# Patient Record
Sex: Female | Born: 1965 | Race: White | Hispanic: No | Marital: Married | Smoking: Former smoker
Health system: Southern US, Community
[De-identification: ages and names within clinical notes are randomized; demographics above are authoritative.]

## PROBLEM LIST (undated history)

## (undated) DIAGNOSIS — J45909 Unspecified asthma, uncomplicated: Secondary | ICD-10-CM

## (undated) DIAGNOSIS — E079 Disorder of thyroid, unspecified: Secondary | ICD-10-CM

## (undated) DIAGNOSIS — I10 Essential (primary) hypertension: Secondary | ICD-10-CM

## (undated) DIAGNOSIS — D649 Anemia, unspecified: Secondary | ICD-10-CM

## (undated) DIAGNOSIS — E039 Hypothyroidism, unspecified: Secondary | ICD-10-CM

## (undated) DIAGNOSIS — N393 Stress incontinence (female) (male): Secondary | ICD-10-CM

## (undated) HISTORY — PX: CARDIAC CATHETERIZATION: SHX172

---

## 2005-03-11 ENCOUNTER — Ambulatory Visit: Payer: Self-pay | Admitting: Podiatry

## 2012-07-18 ENCOUNTER — Ambulatory Visit: Payer: Self-pay | Admitting: Internal Medicine

## 2012-09-05 DIAGNOSIS — N393 Stress incontinence (female) (male): Secondary | ICD-10-CM | POA: Insufficient documentation

## 2015-07-17 DIAGNOSIS — F330 Major depressive disorder, recurrent, mild: Secondary | ICD-10-CM | POA: Insufficient documentation

## 2015-11-12 ENCOUNTER — Other Ambulatory Visit: Payer: Self-pay | Admitting: Obstetrics & Gynecology

## 2015-11-12 DIAGNOSIS — Z1231 Encounter for screening mammogram for malignant neoplasm of breast: Secondary | ICD-10-CM

## 2015-11-22 ENCOUNTER — Ambulatory Visit
Admission: EM | Admit: 2015-11-22 | Discharge: 2015-11-22 | Disposition: A | Discharge status: Home / Self Care | Payer: BC Managed Care – PPO | Attending: Family Medicine | Admitting: Family Medicine

## 2015-11-22 ENCOUNTER — Encounter: Payer: Self-pay | Admitting: Emergency Medicine

## 2015-11-22 DIAGNOSIS — S39012A Strain of muscle, fascia and tendon of lower back, initial encounter: Secondary | ICD-10-CM

## 2015-11-22 HISTORY — DX: Disorder of thyroid, unspecified: E07.9

## 2015-11-22 LAB — URINALYSIS COMPLETE WITH MICROSCOPIC (ARMC ONLY)
BACTERIA UA: NONE SEEN
BILIRUBIN URINE: NEGATIVE
GLUCOSE, UA: NEGATIVE mg/dL
HGB URINE DIPSTICK: NEGATIVE
Ketones, ur: NEGATIVE mg/dL
Leukocytes, UA: NEGATIVE
NITRITE: NEGATIVE
Protein, ur: NEGATIVE mg/dL
RBC / HPF: NONE SEEN RBC/hpf (ref 0–5)
Specific Gravity, Urine: 1.01 (ref 1.005–1.030)
pH: 7 (ref 5.0–8.0)

## 2015-11-22 MED ORDER — IBUPROFEN 800 MG PO TABS: 800.0000 mg | ORAL_TABLET | Freq: Three times a day (TID) | ORAL | Status: DC

## 2015-11-22 MED ORDER — CYCLOBENZAPRINE HCL 10 MG PO TABS: 10.0000 mg | ORAL_TABLET | Freq: Every day | ORAL | Status: DC

## 2015-11-22 MED ORDER — HYDROCODONE-ACETAMINOPHEN 5-325 MG PO TABS: 1.0000 | ORAL_TABLET | Freq: Four times a day (QID) | ORAL | Status: DC | PRN

## 2015-11-22 NOTE — ED Provider Notes (Signed)
CSN: [id number]     Arrival date & time 11/22/15  0803 History   First MD Initiated Contact with Patient 11/22/15 [phone]     Chief Complaint  Patient presents with  . Back Pain   (Consider location/radiation/quality/duration/timing/severity/associated sxs/prior Treatment) Patient is a 50 y.o. female presenting with back pain. The history is provided by the patient.  Back Pain Location:  Lumbar spine Quality:  Aching Radiates to:  Does not radiate Pain severity:  Moderate Pain is:  Same all the time Onset quality:  Sudden Duration:  1 day Timing:  Constant Chronicity:  New Context: lifting heavy objects   Context: not emotional stress, not falling, not jumping from heights, not MCA, not MVA, not occupational injury, not pedestrian accident, not physical stress, not recent illness, not recent injury and not twisting   Relieved by:  None tried Ineffective treatments:  None tried Associated symptoms: no abdominal pain, no abdominal swelling, no bladder incontinence, no bowel incontinence, no chest pain, no dysuria, no fever, no headaches, no leg pain, no numbness, no paresthesias, no pelvic pain, no perianal numbness, no tingling, no weakness and no weight loss   Risk factors: no hx of cancer, no hx of osteoporosis, no lack of exercise, no menopause, not obese, not pregnant, no recent surgery, no steroid use and no vascular disease     Past Medical History  Diagnosis Date  . Thyroid disease    History reviewed. No pertinent past surgical history. History reviewed. No pertinent family history. Social History  Substance Use Topics  . Smoking status: Never Smoker   . Smokeless tobacco: None  . Alcohol Use: No   OB History    No data available     Review of Systems  Constitutional: Negative for fever and weight loss.  Cardiovascular: Negative for chest pain.  Gastrointestinal: Negative for abdominal pain and bowel incontinence.  Genitourinary: Negative for bladder incontinence,  dysuria and pelvic pain.  Musculoskeletal: Positive for back pain.  Neurological: Negative for tingling, weakness, numbness, headaches and paresthesias.    Allergies  Review of patient's allergies indicates no known allergies.  Home Medications   Prior to Admission medications   Medication Sig Start Date End Date Taking? Authorizing Provider  levothyroxine (SYNTHROID, LEVOTHROID) 100 MCG tablet Take 100 mcg by mouth daily before breakfast.   Yes Historical Provider, MD  cyclobenzaprine (FLEXERIL) 10 MG tablet Take 1 tablet (10 mg total) by mouth at bedtime. 11/22/15   Payton Mccallumrlando Marzell Isakson, MD  HYDROcodone-acetaminophen (NORCO/VICODIN) 5-325 MG tablet Take 1-2 tablets by mouth every 6 (six) hours as needed. 11/22/15   Payton Mccallumrlando Ryin Schillo, MD  ibuprofen (ADVIL,MOTRIN) 800 MG tablet Take 1 tablet (800 mg total) by mouth 3 (three) times daily. 11/22/15   Payton Mccallumrlando Kynsie Falkner, MD   Meds Ordered and Administered this Visit  Medications - No data to display  BP 146/84 mmHg  Pulse 88  Temp(Src) 97.2 F (36.2 C) (Tympanic)  Resp 16  Ht 5\' 3"  (1.6 m)  Wt 165 lb (74.844 kg)  BMI 29.24 kg/m2  SpO2 100%  LMP 11/08/2015 (Approximate) No data found.   Physical Exam  Constitutional: She appears well-developed and well-nourished. No distress.  Musculoskeletal: She exhibits tenderness. She exhibits no edema.       Lumbar back: She exhibits tenderness (over the left lumbar sacral paraspinous muscles) and spasm. She exhibits normal range of motion, no bony tenderness, no swelling, no edema, no deformity, no laceration, no pain and normal pulse.  Neurological: She is alert. She has  normal reflexes. She displays normal reflexes. She exhibits normal muscle tone. Coordination normal.  Skin: Skin is warm and dry. No rash noted. She is not diaphoretic. No erythema.  Nursing note and vitals reviewed.   ED Course  Procedures (including critical care time)  Labs Review Labs Reviewed  URINALYSIS COMPLETEWITH MICROSCOPIC  ([facility] ONLY) - Abnormal; Notable for the following:    Color, Urine STRAW (*)    Squamous Epithelial / LPF 0-5 (*)    All other components within normal limits    Imaging Review No results found.   Visual Acuity Review  Right Eye Distance:   Left Eye Distance:   Bilateral Distance:    Right Eye Near:   Left Eye Near:    Bilateral Near:         MDM   1. Low back strain, initial encounter    Discharge Medication List as of 11/22/2015  9:07 AM    START taking these medications   Details  cyclobenzaprine (FLEXERIL) 10 MG tablet Take 1 tablet (10 mg total) by mouth at bedtime., Starting 11/22/2015, Until Discontinued, Normal    HYDROcodone-acetaminophen (NORCO/VICODIN) 5-325 MG tablet Take 1-2 tablets by mouth every 6 (six) hours as needed., Starting 11/22/2015, Until Discontinued, Print    ibuprofen (ADVIL,MOTRIN) 800 MG tablet Take 1 tablet (800 mg total) by mouth 3 (three) times daily., Starting 11/22/2015, Until Discontinued, Normal       1. diagnosis reviewed with patient 2. rx as per orders above; reviewed possible side effects, interactions, risks and benefits  3. Recommend supportive treatment with heat, gentle stretching 4. Follow-up prn if symptoms worsen or don't improve    Payton Mccallum, MD 11/22/15 [phone]

## 2015-11-22 NOTE — ED Notes (Signed)
Patient c/o left sided lower back pain that started yesterday.  Patient reports pain is worse with movement and when sitting.  Patient denies fevers.  Patient denies N/V. Patient denies injury or fall.

## 2016-01-06 ENCOUNTER — Ambulatory Visit: Payer: BC Managed Care – PPO

## 2016-02-03 ENCOUNTER — Ambulatory Visit (INDEPENDENT_AMBULATORY_CARE_PROVIDER_SITE_OTHER): Payer: Self-pay | Admitting: Urology

## 2016-02-03 VITALS — BP 142/79 | HR 80 | Ht 63.0 in | Wt 171.4 lb

## 2016-02-03 DIAGNOSIS — N3946 Mixed incontinence: Secondary | ICD-10-CM | POA: Diagnosis not present

## 2016-02-03 DIAGNOSIS — N820 Vesicovaginal fistula: Secondary | ICD-10-CM | POA: Diagnosis not present

## 2016-02-03 LAB — MICROSCOPIC EXAMINATION

## 2016-02-03 LAB — URINALYSIS, COMPLETE
BILIRUBIN UA: NEGATIVE
Glucose, UA: NEGATIVE
Ketones, UA: NEGATIVE
Nitrite, UA: NEGATIVE
PH UA: 6 (ref 5.0–7.5)
Protein, UA: NEGATIVE
Specific Gravity, UA: 1.015 (ref 1.005–1.030)
Urobilinogen, Ur: 0.2 mg/dL (ref 0.2–1.0)

## 2016-02-03 NOTE — Progress Notes (Signed)
02/03/2016 3:19 PM   Gabrielle Mendoza 1965-07-06 [id number]  Referring provider: Rolm Gala, MD [address] [place], [place] [address]  Chief Complaint  Patient presents with  . New Patient (Initial Visit)    vesicovaginal fistula, pessary    HPI: The patient has mixed stress and urge incontinence worsening over a number years. She leaks with coughing sneezing bending lifting. She is urge incontinence and denies enuresis. Both are significant. I think sometimes she can trigger a high volume leakage with coughing  She voids every 2-3 hours and sometimes more frequently. She gets up 3 times a night and has mild ankle edema. She is prone to constipation and by history tried one medication to cause dry mouth  She has no neurologic issues and has not had previous GU surgery. She has not had a hysterectomy. She does not get urinary tract infections  Modifying factors: There are no other modifying factors  Associated signs and symptoms: There are no other associated signs and symptoms Aggravating and relieving factors: There are no other aggravating or relieving factors Severity: Moderate Duration: Persistent   PMH: Past Medical History:  Diagnosis Date  . Thyroid disease     Surgical History: No past surgical history on file.  Home Medications:    Medication List       Accurate as of 02/03/16  3:19 PM. Always use your most recent med list.          cyclobenzaprine 10 MG tablet Commonly known as:  FLEXERIL Take 1 tablet (10 mg total) by mouth at bedtime.   HYDROcodone-acetaminophen 5-325 MG tablet Commonly known as:  NORCO/VICODIN Take 1-2 tablets by mouth every 6 (six) hours as needed.   ibuprofen 800 MG tablet Commonly known as:  ADVIL,MOTRIN Take 1 tablet (800 mg total) by mouth 3 (three) times daily.   levothyroxine 100 MCG tablet Commonly known as:  SYNTHROID, LEVOTHROID Take 100 mcg by mouth daily before breakfast.       Allergies:  Allergies    Allergen Reactions  . Tramadol Nausea Only    Family History: No family history on file.  Social History:  reports that she has never smoked. She does not have any smokeless tobacco history on file. She reports that she does not drink alcohol. Her drug history is not on file.  ROS: UROLOGY Frequent Urination?: Yes Hard to postpone urination?: Yes Burning/pain with urination?: No Get up at night to urinate?: Yes Leakage of urine?: Yes Urine stream starts and stops?: No Trouble starting stream?: No Do you have to strain to urinate?: No Blood in urine?: No Urinary tract infection?: No Sexually transmitted disease?: No Injury to kidneys or bladder?: No Painful intercourse?: No Weak stream?: No Currently pregnant?: No Vaginal bleeding?: No Last menstrual period?: n  Gastrointestinal Nausea?: No Vomiting?: No Indigestion/heartburn?: No Diarrhea?: No Constipation?: No  Constitutional Fever: No Night sweats?: Yes Weight loss?: No Fatigue?: Yes  Skin Skin rash/lesions?: No Itching?: No  Eyes Blurred vision?: No Double vision?: No  Ears/Nose/Throat Sore throat?: No Sinus problems?: No  Hematologic/Lymphatic Swollen glands?: No Easy bruising?: No  Cardiovascular Leg swelling?: No Chest pain?: No  Respiratory Cough?: No Shortness of breath?: Yes  Endocrine Excessive thirst?: No  Musculoskeletal Back pain?: No Joint pain?: No  Neurological Headaches?: No Dizziness?: No  Psychologic Depression?: No Anxiety?: No  Physical Exam: BP (!) 142/79   Pulse 80   Ht  (1.6 m)   Wt 171 lb 6.4 oz (77.7 kg)  BMI 30.36 kg/m   Constitutional:  Alert and oriented, No acute distress. HEENT: [place] AT, moist mucus membranes.  Trachea midline, no masses. Cardiovascular: No clubbing, cyanosis, or edema. Respiratory: Normal respiratory effort, no increased work of breathing. GI: Abdomen is soft, nontender, nondistended, no abdominal masses GU: Mild grade 2  hyper mobility of the bladder neck and a mild positive cough test with no prolapse Skin: No rashes, bruises or suspicious lesions. Lymph: No cervical or inguinal adenopathy. Neurologic: Grossly intact, no focal deficits, moving all 4 extremities. Psychiatric: Normal mood and affect.  Laboratory Data: No results found for: WBC, HGB, HCT, MCV, PLT      Component Value Date/Time   COLORURINE STRAW (A) 11/22/2015 0822   APPEARANCEUR CLEAR 11/22/2015 0822   LABSPEC 1.010 11/22/2015 0822   PHURINE 7.0 11/22/2015 0822   GLUCOSEU NEGATIVE 11/22/2015 0822   HGBUR NEGATIVE 11/22/2015 0822   BILIRUBINUR NEGATIVE 11/22/2015 0822   KETONESUR NEGATIVE 11/22/2015 0822   PROTEINUR NEGATIVE 11/22/2015 0822   NITRITE NEGATIVE 11/22/2015 0822   LEUKOCYTESUR NEGATIVE 11/22/2015 16100822    Pertinent Imaging: None  Assessment & Plan:  The patient has mixed incontinence. She has mild frequency and moderate nocturia. Pathophysiology discussed. Role of urodynamics discussed.  The patient was tested for a possible fistula a may have had a little bit of blue dye on the pad in the vagina. She does not have a risk factor for the diagnosis and I do not believe she leaks from the vagina. Having said that we will look for this also during the urodynamic test. There was no urine in her vaginal vault today  1. Mixed incontinence 2. Urinary frequency 3. Nocturia - Urinalysis, Complete   No Follow-up on file.  Martina SinnerMACDIARMID,Jeily Guthridge A, MD  [facility] [address], [address] [place], [place] [address] [phone]

## 2016-03-23 ENCOUNTER — Ambulatory Visit: Payer: BC Managed Care – PPO

## 2017-02-02 ENCOUNTER — Other Ambulatory Visit: Payer: Self-pay | Admitting: Family Medicine

## 2017-02-02 DIAGNOSIS — Z1231 Encounter for screening mammogram for malignant neoplasm of breast: Secondary | ICD-10-CM

## 2017-02-09 ENCOUNTER — Inpatient Hospital Stay: Admission: RE | Admit: 2017-02-09 | Payer: BC Managed Care – PPO | Source: Ambulatory Visit

## 2017-07-23 DIAGNOSIS — Z862 Personal history of diseases of the blood and blood-forming organs and certain disorders involving the immune mechanism: Secondary | ICD-10-CM | POA: Insufficient documentation

## 2017-07-23 DIAGNOSIS — R1013 Epigastric pain: Secondary | ICD-10-CM | POA: Insufficient documentation

## 2017-12-21 ENCOUNTER — Ambulatory Visit
Admission: RE | Admit: 2017-12-21 | Discharge: 2017-12-21 | Disposition: A | Discharge status: Home / Self Care | Payer: BC Managed Care – PPO | Source: Ambulatory Visit | Attending: Family Medicine | Admitting: Family Medicine

## 2017-12-21 ENCOUNTER — Encounter (INDEPENDENT_AMBULATORY_CARE_PROVIDER_SITE_OTHER): Payer: Self-pay

## 2017-12-21 DIAGNOSIS — Z1231 Encounter for screening mammogram for malignant neoplasm of breast: Secondary | ICD-10-CM | POA: Diagnosis present

## 2018-12-02 DIAGNOSIS — I10 Essential (primary) hypertension: Secondary | ICD-10-CM | POA: Insufficient documentation

## 2019-05-22 ENCOUNTER — Other Ambulatory Visit: Payer: Self-pay

## 2019-05-22 DIAGNOSIS — Z20822 Contact with and (suspected) exposure to covid-19: Secondary | ICD-10-CM

## 2019-05-23 LAB — NOVEL CORONAVIRUS, NAA: SARS-CoV-2, NAA: NOT DETECTED

## 2019-07-03 ENCOUNTER — Other Ambulatory Visit: Payer: Self-pay | Admitting: Physician Assistant

## 2019-07-03 DIAGNOSIS — M2391 Unspecified internal derangement of right knee: Secondary | ICD-10-CM

## 2019-07-12 ENCOUNTER — Other Ambulatory Visit: Payer: Self-pay

## 2019-07-12 ENCOUNTER — Ambulatory Visit
Admission: RE | Admit: 2019-07-12 | Discharge: 2019-07-12 | Disposition: A | Discharge status: Home / Self Care | Payer: BC Managed Care – PPO | Source: Ambulatory Visit | Attending: Physician Assistant | Admitting: Physician Assistant

## 2019-07-12 DIAGNOSIS — M2391 Unspecified internal derangement of right knee: Secondary | ICD-10-CM

## 2019-07-28 ENCOUNTER — Ambulatory Visit
Admission: RE | Admit: 2019-07-28 | Discharge: 2019-07-28 | Disposition: A | Discharge status: Home / Self Care | Payer: BC Managed Care – PPO | Source: Ambulatory Visit | Attending: Orthopedic Surgery | Admitting: Orthopedic Surgery

## 2019-07-28 ENCOUNTER — Other Ambulatory Visit: Payer: Self-pay

## 2019-07-28 ENCOUNTER — Other Ambulatory Visit: Admission: RE | Admit: 2019-07-28 | Payer: BC Managed Care – PPO | Source: Ambulatory Visit

## 2019-07-28 ENCOUNTER — Encounter
Admission: RE | Admit: 2019-07-28 | Discharge: 2019-07-28 | Disposition: A | Discharge status: Home / Self Care | Payer: BC Managed Care – PPO | Source: Ambulatory Visit | Attending: Orthopedic Surgery | Admitting: Orthopedic Surgery

## 2019-07-28 DIAGNOSIS — Z01818 Encounter for other preprocedural examination: Secondary | ICD-10-CM | POA: Insufficient documentation

## 2019-07-28 DIAGNOSIS — Z20822 Contact with and (suspected) exposure to covid-19: Secondary | ICD-10-CM | POA: Diagnosis not present

## 2019-07-28 HISTORY — DX: Essential (primary) hypertension: I10

## 2019-07-28 HISTORY — DX: Unspecified asthma, uncomplicated: J45.909

## 2019-07-28 HISTORY — DX: Hypothyroidism, unspecified: E03.9

## 2019-07-28 HISTORY — DX: Stress incontinence (female) (male): N39.3

## 2019-07-28 HISTORY — DX: Anemia, unspecified: D64.9

## 2019-07-28 LAB — SARS CORONAVIRUS 2 (TAT 6-24 HRS): SARS Coronavirus 2: NEGATIVE

## 2019-07-28 NOTE — Patient Instructions (Signed)
Your procedure is scheduled on: Mon 2/1 Report to Day Surgery. To find out your arrival time please call [phone] between 1PM - 3PM on Friday 1/29.  Remember: Instructions that are not followed completely may result in serious medical risk,  up to and including death, or upon the discretion of your surgeon and anesthesiologist your  surgery may need to be rescheduled.     _X__ 1. Do not eat food after midnight the night before your procedure.                 No gum chewing or hard candies. You may drink clear liquids up to 2 hours                 before you are scheduled to arrive for your surgery- DO not drink clear                 liquids within 2 hours of the start of your surgery.                 Clear Liquids include:  water, apple juice without pulp, clear carbohydrate                 drink such as Clearfast of Gatorade, Black Coffee or Tea (Do not add                 anything to coffee or tea).  __X__2.  On the morning of surgery brush your teeth with toothpaste and water, you                may rinse your mouth with mouthwash if you wish.  Do not swallow any toothpaste of mouthwash.     _X__ 3.  No Alcohol for 24 hours before or after surgery.   ___ 4.  Do Not Smoke or use e-cigarettes For 24 Hours Prior to Your Surgery.                 Do not use any chewable tobacco products for at least 6 hours prior to                 surgery.  ____  5.  Bring all medications with you on the day of surgery if instructed.   _x___  6.  Notify your doctor if there is any change in your medical condition      (cold, fever, infections).     Do not wear jewelry, make-up, hairpins, clips or nail polish. Do not wear lotions, powders, or perfumes. You may wear deodorant. Do not shave 48 hours prior to surgery. Men may shave face and neck. Do not bring valuables to the hospital.    [facility] is not responsible for any belongings or valuables.  Contacts, dentures  or bridgework may not be worn into surgery. Leave your suitcase in the car. After surgery it may be brought to your room. For patients admitted to the hospital, discharge time is determined by your treatment team.   Patients discharged the day of surgery will not be allowed to drive home.   Please read over the following fact sheets that you were given:    __x__ Take these medicines the morning of surgery with A SIP OF WATER:    1. levothyroxine (SYNTHROID, LEVOTHROID) 100 MCG tablet  2.   3.   4.  5.  6.  ____ Fleet Enema (as directed)   __x__ Use CHG Soap as directed  __x__ Use  inhalers on the day of surgery albuterol (VENTOLIN HFA) 108 (90 Base) MCG/ACT inhaler  And bring with you  ____ Stop metformin 2 days prior to surgery    ____ Take 1/2 of usual insulin dose the night before surgery. No insulin the morning          of surgery.   ____ Stop Coumadin/Plavix/aspirin on   __x__ Stop Anti-inflammatories ibuprofen (ADVIL) 200 MG tablet motrin aleve or aspirin    ____ Stop supplements until after surgery.    ____ Bring C-Pap to the hospital.

## 2019-07-30 NOTE — H&P (Signed)
ORTHOPAEDIC HISTORY & PHYSICAL  Progress Notes by Shari Heritage., MD at 07/20/2019 11:30 AM  Chief Complaint:     Chief Complaint  Patient presents with  . Knee Pain    Right knee meniscus tear, MRI 07/12/19    Reason for Visit: The patient is a 54 y.o. female who presents today for reevaluation of her right knee. She reports a 2 month(s) history of right knee pain without any apparent trauma or aggravating event.  She localizes most of the pain along the medial aspect of the knee. She reports some swelling, no locking, and some giving way of the knee. The pain is aggravated by going up and down stairs, lateral movements, pivoting, rising after sitting and squatting. She does have some pain at night that interferes with her sleep.The patient has not appreciated any significant improvement despite NSAIDs, intraarticular corticosteroid injection, and activity modification.   Medications: Current Medications        Current Outpatient Medications  Medication Sig Dispense Refill  . albuterol 90 mcg/actuation inhaler Inhale 2 inhalations into the lungs every 4 (four) hours as needed for Wheezing 1 Inhaler 2  . cyanocobalamin (VITAMIN B12) 1000 MCG tablet Take 1,000 mcg by mouth once daily    . ferrous fumarate-vitamin C (VITRON-C) 65 mg iron- 125 mg tablet Take 1 tablet by mouth once daily       . ibuprofen (ADVIL) 200 MG tablet Take 400 mg by mouth 2 (two) times daily as needed for Pain    . levothyroxine (EUTHYROX) 100 MCG tablet Take on an empty stomach with a glass of water at least 30-60 minutes before breakfast. 30 tablet 5  . losartan (COZAAR) 25 MG tablet Take 1 tablet (25 mg total) by mouth once daily 30 tablet 11   No current facility-administered medications for this visit.       Allergies:     Allergies  Allergen Reactions  . Tramadol Nausea    Past Medical History:     Past Medical History:  Diagnosis Date  . Anemia   . Rhinitis, allergic    . Stress incontinence 09/05/2012  . Thyroid disease     Past Surgical History:      Past Surgical History:  Procedure Laterality Date  . no past surgery      Social History: Social History  Social History        Socioeconomic History  . Marital status: Married    Spouse name: Arlys John  . Number of children: 3  . Years of education: 69  . Highest education level: High school graduate  Occupational History  . 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 Financial resource strain: Not on file  . Food insecurity    Worry: Not on file    Inability: Not on file  . Transportation needs    Medical: Not on file    Non-medical: Not on file  Tobacco Use  . Smoking status: Former Smoker    Packs/day: 1.00    Years: 20.00    Pack years: 20.00    Types: Cigarettes    Quit date: 08/08/2012    Years since quitting: 6.9  . Smokeless tobacco: Never Used  Substance and Sexual Activity  . Alcohol use: No  . Drug use: No  . Sexual activity: Yes    Partners: Male  Lifestyle  . Physical activity    Days per week: Not on file    Minutes per session: Not on file  .  Stress: Not on file  Relationships  . Social Musician on phone: Not on file    Gets together: Not on file    Attends religious service: Not on file    Active member of club or organization: Not on file    Attends meetings of clubs or organizations: Not on file    Relationship status: Not on file  Other Topics Concern  . Not on file  Social History Narrative   Married in 1995, 3 sons ages 43, 65, 75.  SMokes 1ppd x 10 years, wants to quit.  No etoh, no drugs.  She is a Audiological scientist.  HS educatoin.  Walks for exercise.  Wears seatbelts, sunscreen.  No relgious beliefs affecting health care.        Family History:      Family History  Problem Relation Age of Onset  . High blood pressure (Hypertension) Mother   .  Myocardial Infarction (Heart attack) Mother   . Breast cancer Mother   . Diabetes type II Mother   . Stroke Mother   . Thyroid disease Mother   . Glaucoma Mother   . Myocardial Infarction (Heart attack) Father   . Cirrhosis Father     Review of Systems: A comprehensive 14 point ROS was performed, reviewed, and the pertinent orthopaedic findings are documented in the HPI.  Exam BP (!) 148/92   Temp 36.5 C (97.7 F) (Skin)   Ht 160 cm (5\' 3" )   Wt 84.6 kg (186 lb 6.4 oz)   BMI 33.02 kg/m   General:  Well-developed, well-nourished female seen in no acute distress.  Antalgic gait.  No varus or valgus thrust to the right knee.  HEENT:  Atraumatic, normocephalic.  Pupils are equal and reactive to light.  Extraocular motion is intact. Sclera are clear.  Oropharynx is clear with moist mucosa.  Lungs:  Clear to auscultation bilaterally.  Cardiovascular: Regular rate and rhythm.  Normal S1, S2.  No murmur .  No appreciable gallops or rubs. Peripheral pulses are palpable.  No lower extremity edema.  Homan`s test is negative.   Extremities: Good strength, stability, and range of motion of the upper extremities. Good range of motion of the hips and ankles.  Right Knee:        Soft tissue swelling: minimal    Effusion:                   minimal    Erythema:                 none    Crepitance:               minimal    Tenderness:             medial, posterior    Alignment:                normal    Mediolateral laxity:   stable    Anterior drawer test:negative    Lachman`s test:       negative    McMurray`s test:      positive    Atrophy:                    No significant atrophy.  Quadriceps tone was fair to good.    Range of Motion:     greater than 120  degrees  Neurologic:  Awake, alert, and oriented.  Sensory function is intact to pinprick and light touch.   Motor strength is judged to be 5/5.   Motor coordination  is within normal limits.   No apparent clonus. No tremor.    MRI: I reviewed the right knee MRI from [facility] dated 07/12/2019.  I concur with the radiologist's interpretation as below:  MRI OF THE RIGHT KNEE WITHOUT CONTRAST  TECHNIQUE: Multiplanar, multisequence MR imaging of the knee was performed. No intravenous contrast was administered.  COMPARISON: None.  FINDINGS: MENISCI  Medial meniscus: Horizontal tear of the posterior horn extending into the body. Longitudinal tear of the posterior root.  Lateral meniscus: Intact.  LIGAMENTS  Cruciates: Intact ACL and PCL.  Collaterals: Medial collateral ligament is intact. Lateral collateral ligament complex is intact.  CARTILAGE  Patellofemoral: Partial-thickness cartilage loss over the medial trochlea with subchondral marrow edema.  Medial: Diffuse thinning with near full-thickness cartilage loss over the central weight-bearing medial femoral condyle and medial tibial plateau.  Lateral: Diffuse thinning without focal defect.  Joint: Small joint effusion. Mild edema within Hoffa's fat. No plical thickening.  Popliteal Fossa: Tiny Baker cyst. Intact popliteus tendon.  Extensor Mechanism: Intact quadriceps tendon and patellar tendon. Intact medial and lateral patellar retinaculum. Intact MPFL.  Bones: No acute fracture or dislocation. No suspicious bone lesion.  Other: None.  IMPRESSION: 1. Horizontal tear of the medial meniscus posterior horn extending into the body. Additional longitudinal tear of the posterior root. 2. 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  Electronically Signed  By: Titus Dubin M.D.  On: 07/12/2019 15:43   Impression: Internal derangement of the right knee  Plan:   The findings were discussed in detail with the patient. The patient was given informational material on knee arthroscopy. Conservative treatment  options were reviewed with the patient.  We discussed the risks and benefits of surgical intervention.  The usual perioperative course was also discussed in detail. Arthroscopy is an appropriate treatment for the meniscal pathology, but would have limited or no effect on degenerative changes of the articular cartilage.  The patient expressed understanding of the risks and benefits of surgical intervention and would like to proceed with plans for right knee arthroscopy.  MEDICAL CLEARANCE: Per anesthesiology. ACTIVITIES:  Avoid pivoting, squatting, or twisting. WORK STATUS: Anticipate out of work for 2-3 weeks. THERAPY: Quadriceps strengthening exercises. MEDICATIONS: Requested Prescriptions    No prescriptions requested or ordered in this encounter   FOLLOW-UP: Return for preop History & Physical pending surgery date.      Kaj Vasil P. Holley Bouche., M.D.  This note was generated in part with voice recognition software and I apologize for any typographical errors that were not detected and corrected.     Electronically signed by Lamar Benes., MD on 07/23/2019 9:09 PM

## 2019-07-31 ENCOUNTER — Other Ambulatory Visit: Payer: Self-pay

## 2019-07-31 ENCOUNTER — Ambulatory Visit
Admission: RE | Admit: 2019-07-31 | Discharge: 2019-07-31 | Disposition: A | Discharge status: Home / Self Care | Payer: BC Managed Care – PPO | Attending: Orthopedic Surgery | Admitting: Orthopedic Surgery

## 2019-07-31 ENCOUNTER — Encounter: Payer: Self-pay | Admitting: Orthopedic Surgery

## 2019-07-31 ENCOUNTER — Encounter
Admission: RE | Disposition: A | Discharge status: Home / Self Care | Payer: Self-pay | Source: Home / Self Care | Attending: Orthopedic Surgery

## 2019-07-31 ENCOUNTER — Ambulatory Visit: Payer: BC Managed Care – PPO | Admitting: Certified Registered Nurse Anesthetist

## 2019-07-31 DIAGNOSIS — Z87891 Personal history of nicotine dependence: Secondary | ICD-10-CM | POA: Insufficient documentation

## 2019-07-31 DIAGNOSIS — Z8249 Family history of ischemic heart disease and other diseases of the circulatory system: Secondary | ICD-10-CM | POA: Insufficient documentation

## 2019-07-31 DIAGNOSIS — I10 Essential (primary) hypertension: Secondary | ICD-10-CM | POA: Insufficient documentation

## 2019-07-31 DIAGNOSIS — J309 Allergic rhinitis, unspecified: Secondary | ICD-10-CM | POA: Insufficient documentation

## 2019-07-31 DIAGNOSIS — D649 Anemia, unspecified: Secondary | ICD-10-CM | POA: Insufficient documentation

## 2019-07-31 DIAGNOSIS — Z885 Allergy status to narcotic agent status: Secondary | ICD-10-CM | POA: Insufficient documentation

## 2019-07-31 DIAGNOSIS — Z79890 Hormone replacement therapy (postmenopausal): Secondary | ICD-10-CM | POA: Insufficient documentation

## 2019-07-31 DIAGNOSIS — Z79899 Other long term (current) drug therapy: Secondary | ICD-10-CM | POA: Insufficient documentation

## 2019-07-31 DIAGNOSIS — Z9889 Other specified postprocedural states: Secondary | ICD-10-CM

## 2019-07-31 DIAGNOSIS — M23221 Derangement of posterior horn of medial meniscus due to old tear or injury, right knee: Secondary | ICD-10-CM | POA: Insufficient documentation

## 2019-07-31 DIAGNOSIS — E039 Hypothyroidism, unspecified: Secondary | ICD-10-CM | POA: Insufficient documentation

## 2019-07-31 DIAGNOSIS — M1711 Unilateral primary osteoarthritis, right knee: Secondary | ICD-10-CM | POA: Insufficient documentation

## 2019-07-31 DIAGNOSIS — M94261 Chondromalacia, right knee: Secondary | ICD-10-CM | POA: Diagnosis not present

## 2019-07-31 HISTORY — PX: CHONDROPLASTY: SHX5177

## 2019-07-31 HISTORY — PX: KNEE ARTHROSCOPY WITH MEDIAL MENISECTOMY: SHX5651

## 2019-07-31 SURGERY — ARTHROSCOPY, KNEE, WITH MEDIAL MENISCECTOMY
Anesthesia: General | Site: Knee | Laterality: Right

## 2019-07-31 MED ORDER — LACTATED RINGERS IV SOLN: INTRAVENOUS | Status: DC

## 2019-07-31 MED ORDER — ONDANSETRON HCL 4 MG/2ML IJ SOLN
INTRAMUSCULAR | Status: DC | PRN
  Administered 2019-07-31: 4 mg via INTRAVENOUS

## 2019-07-31 MED ORDER — LIDOCAINE HCL (PF) 2 % IJ SOLN
INTRAMUSCULAR | Status: AC
  Filled 2019-07-31: qty 10

## 2019-07-31 MED ORDER — FENTANYL CITRATE (PF) 100 MCG/2ML IJ SOLN
25.0000 ug | INTRAMUSCULAR | Status: DC | PRN
  Administered 2019-07-31 (×3): 25 ug via INTRAVENOUS

## 2019-07-31 MED ORDER — SODIUM CHLORIDE 0.9 % IV SOLN: INTRAVENOUS | Status: DC

## 2019-07-31 MED ORDER — PHENYLEPHRINE HCL (PRESSORS) 10 MG/ML IV SOLN
INTRAVENOUS | Status: AC
  Filled 2019-07-31: qty 1

## 2019-07-31 MED ORDER — BUPIVACAINE-EPINEPHRINE 0.25% -1:200000 IJ SOLN
INTRAMUSCULAR | Status: DC | PRN
  Administered 2019-07-31: 25 mL
  Administered 2019-07-31: 5 mL

## 2019-07-31 MED ORDER — FENTANYL CITRATE (PF) 100 MCG/2ML IJ SOLN
INTRAMUSCULAR | Status: AC
  Administered 2019-07-31: 09:00:00 25 ug via INTRAVENOUS
  Filled 2019-07-31: qty 2

## 2019-07-31 MED ORDER — METOCLOPRAMIDE HCL 5 MG/ML IJ SOLN: 5.0000 mg | Freq: Three times a day (TID) | INTRAMUSCULAR | Status: DC | PRN

## 2019-07-31 MED ORDER — CELECOXIB 200 MG PO CAPS
ORAL_CAPSULE | ORAL | Status: AC
  Administered 2019-07-31: 07:00:00 400 mg via ORAL
  Filled 2019-07-31: qty 2

## 2019-07-31 MED ORDER — ACETAMINOPHEN 10 MG/ML IV SOLN
INTRAVENOUS | Status: AC
  Filled 2019-07-31: qty 100

## 2019-07-31 MED ORDER — PROPOFOL 10 MG/ML IV BOLUS
INTRAVENOUS | Status: DC | PRN
  Administered 2019-07-31: 150 mg via INTRAVENOUS
  Administered 2019-07-31: 30 mg via INTRAVENOUS

## 2019-07-31 MED ORDER — OXYCODONE HCL 5 MG/5ML PO SOLN: 5.0000 mg | Freq: Once | ORAL | Status: DC | PRN

## 2019-07-31 MED ORDER — ONDANSETRON HCL 4 MG PO TABS: 4.0000 mg | ORAL_TABLET | Freq: Four times a day (QID) | ORAL | Status: DC | PRN

## 2019-07-31 MED ORDER — FAMOTIDINE 20 MG PO TABS
ORAL_TABLET | ORAL | Status: AC
  Administered 2019-07-31: 20 mg via ORAL
  Filled 2019-07-31: qty 1

## 2019-07-31 MED ORDER — MIDAZOLAM HCL 2 MG/2ML IJ SOLN
INTRAMUSCULAR | Status: AC
  Filled 2019-07-31: qty 2

## 2019-07-31 MED ORDER — ONDANSETRON HCL 4 MG/2ML IJ SOLN
INTRAMUSCULAR | Status: AC
  Filled 2019-07-31: qty 2

## 2019-07-31 MED ORDER — PROPOFOL 10 MG/ML IV BOLUS
INTRAVENOUS | Status: AC
  Filled 2019-07-31: qty 40

## 2019-07-31 MED ORDER — ACETAMINOPHEN 10 MG/ML IV SOLN
INTRAVENOUS | Status: DC | PRN
  Administered 2019-07-31: 1000 mg via INTRAVENOUS

## 2019-07-31 MED ORDER — FENTANYL CITRATE (PF) 100 MCG/2ML IJ SOLN
INTRAMUSCULAR | Status: AC
  Administered 2019-07-31: 25 ug via INTRAVENOUS
  Filled 2019-07-31: qty 2

## 2019-07-31 MED ORDER — MEPERIDINE HCL 50 MG/ML IJ SOLN: 6.2500 mg | INTRAMUSCULAR | Status: DC | PRN

## 2019-07-31 MED ORDER — MIDAZOLAM HCL 2 MG/2ML IJ SOLN
INTRAMUSCULAR | Status: DC | PRN
  Administered 2019-07-31: 2 mg via INTRAVENOUS

## 2019-07-31 MED ORDER — GLYCOPYRROLATE 0.2 MG/ML IJ SOLN
INTRAMUSCULAR | Status: AC
  Filled 2019-07-31: qty 1

## 2019-07-31 MED ORDER — SUCCINYLCHOLINE CHLORIDE 20 MG/ML IJ SOLN
INTRAMUSCULAR | Status: AC
  Filled 2019-07-31: qty 1

## 2019-07-31 MED ORDER — METOCLOPRAMIDE HCL 10 MG PO TABS: 5.0000 mg | ORAL_TABLET | Freq: Three times a day (TID) | ORAL | Status: DC | PRN

## 2019-07-31 MED ORDER — FENTANYL CITRATE (PF) 100 MCG/2ML IJ SOLN
INTRAMUSCULAR | Status: DC | PRN
  Administered 2019-07-31 (×4): 25 ug via INTRAVENOUS

## 2019-07-31 MED ORDER — CELECOXIB 200 MG PO CAPS: 400.0000 mg | ORAL_CAPSULE | Freq: Once | ORAL | Status: AC

## 2019-07-31 MED ORDER — DEXAMETHASONE SODIUM PHOSPHATE 10 MG/ML IJ SOLN
INTRAMUSCULAR | Status: DC | PRN
  Administered 2019-07-31: 10 mg via INTRAVENOUS

## 2019-07-31 MED ORDER — OXYCODONE HCL 5 MG PO TABS: 5.0000 mg | ORAL_TABLET | Freq: Once | ORAL | Status: DC | PRN

## 2019-07-31 MED ORDER — EPINEPHRINE PF 1 MG/ML IJ SOLN
INTRAMUSCULAR | Status: AC
  Filled 2019-07-31: qty 1

## 2019-07-31 MED ORDER — HYDROCODONE-ACETAMINOPHEN 5-325 MG PO TABS: 1.0000 | ORAL_TABLET | ORAL | 0 refills | Status: DC | PRN

## 2019-07-31 MED ORDER — CHLORHEXIDINE GLUCONATE 4 % EX LIQD: 60.0000 mL | Freq: Once | CUTANEOUS | Status: DC

## 2019-07-31 MED ORDER — SEVOFLURANE IN SOLN
RESPIRATORY_TRACT | Status: AC
  Filled 2019-07-31: qty 250

## 2019-07-31 MED ORDER — FAMOTIDINE 20 MG PO TABS: 20.0000 mg | ORAL_TABLET | Freq: Once | ORAL | Status: AC

## 2019-07-31 MED ORDER — LIDOCAINE HCL (CARDIAC) PF 100 MG/5ML IV SOSY
PREFILLED_SYRINGE | INTRAVENOUS | Status: DC | PRN
  Administered 2019-07-31: 100 mg via INTRAVENOUS

## 2019-07-31 MED ORDER — MORPHINE SULFATE 4 MG/ML IJ SOLN
INTRAMUSCULAR | Status: DC | PRN
  Administered 2019-07-31: 4 mg via INTRAVENOUS

## 2019-07-31 MED ORDER — FENTANYL CITRATE (PF) 100 MCG/2ML IJ SOLN
INTRAMUSCULAR | Status: AC
  Filled 2019-07-31: qty 2

## 2019-07-31 MED ORDER — MORPHINE SULFATE (PF) 4 MG/ML IV SOLN
INTRAVENOUS | Status: AC
  Filled 2019-07-31: qty 1

## 2019-07-31 MED ORDER — ONDANSETRON HCL 4 MG/2ML IJ SOLN: 4.0000 mg | Freq: Four times a day (QID) | INTRAMUSCULAR | Status: DC | PRN

## 2019-07-31 MED ORDER — DEXAMETHASONE SODIUM PHOSPHATE 10 MG/ML IJ SOLN
INTRAMUSCULAR | Status: AC
  Filled 2019-07-31: qty 1

## 2019-07-31 MED ORDER — PROMETHAZINE HCL 25 MG/ML IJ SOLN: 6.2500 mg | INTRAMUSCULAR | Status: DC | PRN

## 2019-07-31 MED ORDER — EPINEPHRINE PF 1 MG/ML IJ SOLN
INTRAMUSCULAR | Status: AC
  Filled 2019-07-31: qty 4

## 2019-07-31 MED ORDER — BUPIVACAINE HCL (PF) 0.25 % IJ SOLN
INTRAMUSCULAR | Status: AC
  Filled 2019-07-31: qty 30

## 2019-07-31 MED ORDER — ROCURONIUM BROMIDE 50 MG/5ML IV SOLN
INTRAVENOUS | Status: AC
  Filled 2019-07-31: qty 1

## 2019-07-31 SURGICAL SUPPLY — 29 items
ADAPTER IRRIG TUBE 2 SPIKE SOL (ADAPTER) ×6 IMPLANT
BLADE SHAVER 4.5 DBL SERAT CV (CUTTER) IMPLANT
BNDG ESMARK 4X12 TAN STRL LF (GAUZE/BANDAGES/DRESSINGS) ×1 IMPLANT
COVER WAND RF STERILE (DRAPES) ×3 IMPLANT
CUFF TOURN SGL QUICK 24 (TOURNIQUET CUFF)
CUFF TOURN SGL QUICK 30 (TOURNIQUET CUFF)
CUFF TRNQT CYL 24X4X16.5-23 (TOURNIQUET CUFF) IMPLANT
CUFF TRNQT CYL 30X4X21-28X (TOURNIQUET CUFF) IMPLANT
DRSG DERMACEA 8X12 NADH (GAUZE/BANDAGES/DRESSINGS) ×3 IMPLANT
DURAPREP 26ML APPLICATOR (WOUND CARE) ×6 IMPLANT
GAUZE SPONGE 4X4 12PLY STRL (GAUZE/BANDAGES/DRESSINGS) ×3 IMPLANT
GLOVE BIOGEL M STRL SZ7.5 (GLOVE) ×3 IMPLANT
GLOVE INDICATOR 8.0 STRL GRN (GLOVE) ×3 IMPLANT
GOWN STRL REUS W/ TWL LRG LVL3 (GOWN DISPOSABLE) ×4 IMPLANT
GOWN STRL REUS W/TWL LRG LVL3 (GOWN DISPOSABLE) ×2
IV LACTATED RINGER IRRG 3000ML (IV SOLUTION) ×7
IV LR IRRIG 3000ML ARTHROMATIC (IV SOLUTION) ×12 IMPLANT
KIT TURNOVER KIT A (KITS) ×3 IMPLANT
MANIFOLD NEPTUNE II (INSTRUMENTS) ×3 IMPLANT
PACK ARTHROSCOPY KNEE (MISCELLANEOUS) ×3 IMPLANT
SET TUBE SUCT SHAVER OUTFL 24K (TUBING) ×3 IMPLANT
SET TUBE TIP INTRA-ARTICULAR (MISCELLANEOUS) ×3 IMPLANT
SOL PREP PVP 2OZ (MISCELLANEOUS) ×3
SOLUTION PREP PVP 2OZ (MISCELLANEOUS) ×2 IMPLANT
SUT ETHILON 3-0 FS-10 30 BLK (SUTURE) ×3
SUTURE EHLN 3-0 FS-10 30 BLK (SUTURE) ×2 IMPLANT
TUBING ARTHRO INFLOW-ONLY STRL (TUBING) ×3 IMPLANT
WAND HAND CNTRL MULTIVAC 50 (MISCELLANEOUS) ×3 IMPLANT
WRAP KNEE W/COLD PACKS 25.5X14 (SOFTGOODS) ×3 IMPLANT

## 2019-07-31 NOTE — Anesthesia Postprocedure Evaluation (Signed)
Anesthesia Post Note  Patient: Gabrielle Mendoza  Procedure(s) Performed: KNEE ARTHROSCOPY WITH MEDIAL MENISECTOMY (Right Knee) CHONDROPLASTY (Right )  Patient location during evaluation: PACU Anesthesia Type: General Level of consciousness: awake and alert and oriented Pain management: pain level controlled Vital Signs Assessment: post-procedure vital signs reviewed and stable Respiratory status: spontaneous breathing, nonlabored ventilation and respiratory function stable Cardiovascular status: blood pressure returned to baseline and stable Postop Assessment: no signs of nausea or vomiting Anesthetic complications: no     Last Vitals:  Vitals:   07/31/19 0939 07/31/19 1013  BP: (!) 150/66 124/68  Pulse: 65 69  Resp: 14 16  Temp: 36.4 C   SpO2: 98% 98%    Last Pain:  Vitals:   07/31/19 1013  TempSrc:   PainSc: 0-No pain                 Kashara Blocher

## 2019-07-31 NOTE — Transfer of Care (Signed)
Immediate Anesthesia Transfer of Care Note  Patient: Gabrielle Mendoza  Procedure(s) Performed: ARTHROSCOPY KNEE (Right Knee)  Patient Location: PACU  Anesthesia Type:General  Level of Consciousness: sedated  Airway & Oxygen Therapy: Patient Spontanous Breathing and Patient connected to face mask oxygen  Post-op Assessment: Report given to RN and Post -op Vital signs reviewed and stable  Post vital signs: Reviewed and stable  Last Vitals:  Vitals Value Taken Time  BP 112/65   Temp    Pulse 88 07/31/19 0834  Resp 11 07/31/19 0834  SpO2 100 % 07/31/19 0834  Vitals shown include unvalidated device data.  Last Pain:  Vitals:   07/31/19 0613  TempSrc: Tympanic  PainSc: 5          Complications: No apparent anesthesia complications

## 2019-07-31 NOTE — Discharge Instructions (Signed)
°  Instructions after Knee Arthroscopy  ° °- James P. Hooten, Jr., M.D.    ° Dept. of Orthopaedics & Sports Medicine ° [facility] ° [address] ° [place], [place]  [address] ° ° Phone: [phone]   Fax: [phone] ° ° °DIET: °• Drink plenty of non-alcoholic fluids & begin a light diet. °• Resume your normal diet the day after surgery. ° °ACTIVITY:  °• You may use crutches or a walker with weight-bearing as tolerated, unless instructed otherwise. °• You may wean yourself off of the walker or crutches as tolerated.  °• Begin doing gentle exercises. Exercising will reduce the pain and swelling, increase motion, and prevent muscle weakness.   °• Avoid strenuous activities or athletics for a minimum of 4-6 weeks after arthroscopic surgery. °• Do not drive or operate any equipment until instructed. ° °WOUND CARE:  °• Place one to two pillows under the knee the first day or two when sitting or lying.  °• Continue to use the ice packs periodically to reduce pain and swelling. °• The small incisions in your knee are closed with nylon stitches. The stitches will be removed in the office. °• The bulky dressing may be removed on the second day after surgery. DO NOT TOUCH THE STITCHES. Put a Band-Aid over each stitch. Do NOT use any ointments or creams on the incisions.  °• You may bathe or shower after the stitches are removed at the first office visit following surgery. ° °MEDICATIONS: °• You may resume your regular medications. °• Please take the pain medication as prescribed. °• Do not take pain medication on an empty stomach. °• Do not drive or drink alcoholic beverages when taking pain medications. ° °CALL THE OFFICE FOR: °• Temperature above 101 degrees °• Excessive bleeding or drainage on the dressing. °• Excessive swelling, coldness, or paleness of the toes. °• Persistent nausea and vomiting. ° °FOLLOW-UP:  °• You should have an appointment to return to the office in 7-10 days after surgery.   ° ° °AMBULATORY SURGERY  °DISCHARGE INSTRUCTIONS ° ° °1) The drugs that you were given will stay in your system until tomorrow so for the next 24 hours you should not: ° °A) Drive an automobile °B) Make any legal decisions °C) Drink any alcoholic beverage ° ° °2) You may resume regular meals tomorrow.  Today it is better to start with liquids and gradually work up to solid foods. ° °You may eat anything you prefer, but it is better to start with liquids, then soup and crackers, and gradually work up to solid foods. ° ° °3) Please notify your doctor immediately if you have any unusual bleeding, trouble breathing, redness and pain at the surgery site, drainage, fever, or pain not relieved by medication. ° ° ° °4) Additional Instructions: ° ° ° ° ° ° ° °Please contact your physician with any problems or Same Day Surgery at [phone], Monday through Friday 6 am to 4 pm, or [facility] number at [phone]. ° °

## 2019-07-31 NOTE — Anesthesia Procedure Notes (Signed)
Procedure Name: LMA Insertion Performed by: Gavriela Cashin, CRNA Pre-anesthesia Checklist: Patient identified, Patient being monitored, Timeout performed, Emergency Drugs available and Suction available Patient Re-evaluated:Patient Re-evaluated prior to induction Oxygen Delivery Method: Circle system utilized Preoxygenation: Pre-oxygenation with 100% oxygen Induction Type: IV induction Ventilation: Mask ventilation without difficulty LMA: LMA inserted LMA Size: 3.5 Tube type: Oral Number of attempts: 1 Placement Confirmation: positive ETCO2 and breath sounds checked- equal and bilateral Tube secured with: Tape Dental Injury: Teeth and Oropharynx as per pre-operative assessment        

## 2019-07-31 NOTE — Anesthesia Preprocedure Evaluation (Signed)
Anesthesia Evaluation  Patient identified by MRN, date of birth, ID band Patient awake    Reviewed: Allergy & Precautions, NPO status , Patient's Chart, lab work & pertinent test results  History of Anesthesia Complications Negative for: history of anesthetic complications  Airway Mallampati: II  TM Distance: >3 FB Neck ROM: Full    Dental no notable dental hx.    Pulmonary asthma , neg sleep apnea, Patient abstained from smoking., former smoker,    breath sounds clear to auscultation- rhonchi (-) wheezing      Cardiovascular hypertension, Pt. on medications (-) CAD, (-) Past MI, (-) Cardiac Stents and (-) CABG  Rhythm:Regular Rate:Normal - Systolic murmurs and - Diastolic murmurs    Neuro/Psych neg Seizures PSYCHIATRIC DISORDERS Depression negative neurological ROS     GI/Hepatic negative GI ROS, Neg liver ROS,   Endo/Other  Hypothyroidism   Renal/GU negative Renal ROS     Musculoskeletal negative musculoskeletal ROS (+)   Abdominal (+) + obese,   Peds  Hematology  (+) anemia ,   Anesthesia Other Findings Past Medical History: No date: Anemia No date: Asthma No date: Hypertension No date: Hypothyroidism No date: Stress incontinence No date: Thyroid disease   Reproductive/Obstetrics                             Anesthesia Physical Anesthesia Plan  ASA: II  Anesthesia Plan: General   Post-op Pain Management:    Induction: Intravenous  PONV Risk Score and Plan: 2 and Ondansetron, Dexamethasone and Midazolam  Airway Management Planned: LMA  Additional Equipment:   Intra-op Plan:   Post-operative Plan:   Informed Consent: I have reviewed the patients History and Physical, chart, labs and discussed the procedure including the risks, benefits and alternatives for the proposed anesthesia with the patient or authorized representative who has indicated his/her understanding and  acceptance.     Dental advisory given  Plan Discussed with: CRNA and Anesthesiologist  Anesthesia Plan Comments:         Anesthesia Quick Evaluation

## 2019-07-31 NOTE — Op Note (Signed)
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   ASSISTANT: none  ANESTHESIA: general  ESTIMATED BLOOD LOSS: Minimal  FLUIDS REPLACED: 700 mL of crystalloid  TOURNIQUET TIME: Not used  INDICATIONS FOR SURGERY: Gabrielle Mendoza is a 54 y.o. year old female who has been seen for complaints of right knee pain. MRI demonstrated findings consistent with meniscal pathology. After discussion of the risks and benefits of surgical intervention, the patient expressed understanding of the risks benefits and agree with plans for right knee arthroscopy.   PROCEDURE IN DETAIL: The patient was brought into the operating room and, after adequate general anesthesia was achieved, a tourniquet was applied to the right thigh and the leg was placed in the leg holder. All bony prominences were well padded. The patient's right knee was cleaned and prepped with alcohol and Duraprep and draped in the usual sterile fashion. A "timeout" was performed as per usual protocol. The anticipated portal sites were injected with 0.25% Marcaine with epinephrine. An anterolateral incision was made and a cannula was inserted. A medium effusion was evacuated and the knee was distended with fluid using the pump. The scope was advanced down the medial gutter into the medial compartment. Under visualization with the scope, an anteromedial portal was created and a hooked probe was inserted. The medial meniscus was visualized and probed.  There was a complex tear involving the posterior horn of the medial meniscus as well as the  area of the posterior root.  The tear was debrided using meniscal punches and a 4.5 mm incisor shaver.  Final contouring was performed using a 50 degree ArthroCare wand.  The remaining rim of meniscus was visualized and probed felt be stable.  The anterior horn was visualized and noted to be stable.  The articular cartilage was visualized.  There was an area of grade III chondromalacia involving the medial femoral condyle and less so the medial tibial plateau.  These areas were debrided and contoured using the ArthroCare wand.  The scope was then advanced into the intercondylar notch. The anterior cruciate ligament was visualized and probed and felt to be intact. The scope was removed from the lateral portal and reinserted via the anteromedial portal to better visualize the lateral compartment. The lateral meniscus was visualized and probed.  The meniscus was in excellent condition without evidence of tear or instability.  The articular cartilage of the lateral compartment was visualized and noted to be in excellent condition.  Finally, the scope was advanced so as to visualize the patellofemoral articulation. Good patellar tracking was appreciated.  The articular surface was in good condition.  The knee was irrigated with copius amounts of fluid and suctioned dry. The anterolateral portal was re-approximated with #3-0 nylon. A combination of 0.25% Marcaine with epinephrine and 4 mg of Morphine were injected via the scope. The scope was removed and the anteromedial portal was re-approximated with #3-0 nylon. A sterile dressing was applied followed by application of an ice wrap.  The patient tolerated the procedure well and was transported to the PACU in stable condition.  Gabrielle Mendoza,  Montez Hageman., M.D.

## 2019-07-31 NOTE — H&P (Signed)
The patient has been re-examined, and the chart reviewed, and there have been no interval changes to the documented history and physical.    The risks, benefits, and alternatives have been discussed at length. The patient expressed understanding of the risks benefits and agreed with plans for surgical intervention.  Jaxie Racanelli P. Otilia Kareem, Jr. M.D.    

## 2019-09-02 ENCOUNTER — Other Ambulatory Visit: Payer: Self-pay

## 2019-09-02 ENCOUNTER — Ambulatory Visit: Payer: BC Managed Care – PPO | Attending: Internal Medicine

## 2019-09-02 DIAGNOSIS — Z23 Encounter for immunization: Secondary | ICD-10-CM

## 2019-09-02 NOTE — Progress Notes (Signed)
   Covid-19 Vaccination Clinic  Name:  Gabrielle Mendoza    MRN: [id number] DOB: 11-Jan-1966  09/02/2019  Gabrielle Mendoza was observed post Covid-19 immunization for 15 minutes without incident. She was provided with Vaccine Information Sheet and instruction to access the V-Safe system.   Gabrielle Mendoza was instructed to call 911 with any severe reactions post vaccine: Marland Kitchen Difficulty breathing  . Swelling of face and throat  . A fast heartbeat  . A bad rash all over body  . Dizziness and weakness   Immunizations Administered    Name Date Dose VIS Date Route   Moderna COVID-19 Vaccine 09/02/2019  1:34 PM 0.5 mL 05/30/2019 Intramuscular   Manufacturer: Moderna   Lot: [id number]   NDC: 23300-762-26

## 2019-09-05 LAB — HM PAP SMEAR

## 2019-09-30 ENCOUNTER — Ambulatory Visit: Payer: BC Managed Care – PPO

## 2019-09-30 ENCOUNTER — Ambulatory Visit: Payer: BC Managed Care – PPO | Attending: Internal Medicine

## 2019-09-30 DIAGNOSIS — Z23 Encounter for immunization: Secondary | ICD-10-CM

## 2019-09-30 NOTE — Progress Notes (Signed)
   Covid-19 Vaccination Clinic  Name:  Gabrielle Mendoza    MRN: [id number] DOB: 10-Jan-1966  09/30/2019  Gabrielle Mendoza was observed post Covid-19 immunization for 15 minutes without incident. She was provided with Vaccine Information Sheet and instruction to access the V-Safe system.   Gabrielle Mendoza was instructed to call 911 with any severe reactions post vaccine: Marland Kitchen Difficulty breathing  . Swelling of face and throat  . A fast heartbeat  . A bad rash all over body  . Dizziness and weakness   Immunizations Administered    Name Date Dose VIS Date Route   Moderna COVID-19 Vaccine 09/30/2019  9:32 AM 0.5 mL 05/30/2019 Intramuscular   Manufacturer: Gala Murdoch   Lot: [id number]   NDC: 29574-734-03

## 2019-10-01 ENCOUNTER — Ambulatory Visit: Payer: BC Managed Care – PPO

## 2019-11-15 DIAGNOSIS — E210 Primary hyperparathyroidism: Secondary | ICD-10-CM | POA: Insufficient documentation

## 2019-12-19 DIAGNOSIS — E892 Postprocedural hypoparathyroidism: Secondary | ICD-10-CM | POA: Insufficient documentation

## 2021-01-23 ENCOUNTER — Encounter: Payer: Self-pay | Admitting: Nurse Practitioner

## 2021-01-23 DIAGNOSIS — M8580 Other specified disorders of bone density and structure, unspecified site: Secondary | ICD-10-CM | POA: Insufficient documentation

## 2021-01-23 DIAGNOSIS — M85832 Other specified disorders of bone density and structure, left forearm: Secondary | ICD-10-CM | POA: Insufficient documentation

## 2021-02-03 ENCOUNTER — Ambulatory Visit: Payer: Self-pay | Admitting: Nurse Practitioner

## 2021-02-05 ENCOUNTER — Other Ambulatory Visit: Payer: Self-pay

## 2021-02-05 ENCOUNTER — Ambulatory Visit: Payer: Managed Care, Other (non HMO) | Admitting: Nurse Practitioner

## 2021-02-05 ENCOUNTER — Encounter: Payer: Self-pay | Admitting: Nurse Practitioner

## 2021-02-05 VITALS — BP 130/71 | HR 61 | Temp 98.1°F | Ht 62.0 in | Wt 158.0 lb

## 2021-02-05 DIAGNOSIS — E892 Postprocedural hypoparathyroidism: Secondary | ICD-10-CM

## 2021-02-05 DIAGNOSIS — Z7689 Persons encountering health services in other specified circumstances: Secondary | ICD-10-CM

## 2021-02-05 DIAGNOSIS — Z1211 Encounter for screening for malignant neoplasm of colon: Secondary | ICD-10-CM

## 2021-02-05 DIAGNOSIS — Z1231 Encounter for screening mammogram for malignant neoplasm of breast: Secondary | ICD-10-CM

## 2021-02-05 DIAGNOSIS — J301 Allergic rhinitis due to pollen: Secondary | ICD-10-CM

## 2021-02-05 DIAGNOSIS — I10 Essential (primary) hypertension: Secondary | ICD-10-CM

## 2021-02-05 DIAGNOSIS — E039 Hypothyroidism, unspecified: Secondary | ICD-10-CM | POA: Diagnosis not present

## 2021-02-05 NOTE — Assessment & Plan Note (Signed)
Chronic, stable.  BP at goal in office and on home readings.  Recommend she monitor BP at least a few mornings a week at home and document.  DASH diet at home.  Continue current medication regimen and adjust as needed.  Labs at physical in March.  Return in 6 months.

## 2021-02-05 NOTE — Progress Notes (Signed)
New Patient Office Visit  Subjective:  Patient ID: ANNISTEN MANCHESTER, female    DOB: 12-26-1965  Age: 55 y.o. MRN: [id number]  CC:  Chief Complaint  Patient presents with   Hypothyroidism   Hypertension   Parathyroidism    Patient states she has had a surgery to remove 2 of her parathyroid glands. Patient states she a [facility] provider that manages her medication.     HPI Gabrielle Mendoza presents for new patient visit to establish care.  Introduced to Publishing rights manager role and practice setting.  All questions answered.  Discussed provider/patient relationship and expectations.  Works here locally and is transferring care to be closer.    No longer having menstrual cycles -- has not had in several years.  Continues to have occasional hot flashes.  HYPOTHYROIDISM Continues on Levothyroxine 112 MCG daily.  She has been doing Thrive diet over past year -- lost 30 pounds with this.  Last TSH check on 12/11/20 = TSH 6.76 and Free T4 1.27.  Had parathyroid surgery on the 12/04/2019.  She follows with Dr. Smith Robert, endo, at [facility] -- they monitor all thyroid and parathyroid labs. Thyroid control status:stable Satisfied with current treatment? yes Medication side effects: no Medication compliance: good compliance Etiology of hypothyroidism:  Recent dose adjustment:no Fatigue: no Cold intolerance: no Heat intolerance: no Weight gain: no Weight loss: no Constipation: no Diarrhea/loose stools: no Palpitations: no Lower extremity edema: no Anxiety/depressed mood: no   HYPERTENSION Continues on Losartan 50 MG daily, has been on this for one year.  History of smoking -- smoked for 10 to 15 years, smoked 1 PPD, quit 10 years ago.  Family history of cardiac disease -- mother and sister.  Does have seasonal asthma reported, more noted when hot outside -- uses Albuterol as needed, minimally uses. Hypertension status: stable  Satisfied with current treatment? yes Duration of hypertension:  chronic BP monitoring frequency:  a few times a month BP range: <130/80 at home BP medication side effects:  no Medication compliance: good compliance Previous BP meds:Losartan Aspirin: no Recurrent headaches: no Visual changes: no Palpitations: no Dyspnea: no Chest pain: no Lower extremity edema: no Dizzy/lightheaded: no  Past Medical History:  Diagnosis Date   Anemia    Asthma    Hypertension    Hypothyroidism    Stress incontinence    Thyroid disease     Past Surgical History:  Procedure Laterality Date   CARDIAC CATHETERIZATION     CHONDROPLASTY Right 07/31/2019   Procedure: CHONDROPLASTY;  Surgeon: Donato Heinz, MD;  Location: [facility] ORS;  Service: Orthopedics;  Laterality: Right;   KNEE ARTHROSCOPY WITH MEDIAL MENISECTOMY Right 07/31/2019   Procedure: KNEE ARTHROSCOPY WITH MEDIAL MENISECTOMY;  Surgeon: Donato Heinz, MD;  Location: [facility] ORS;  Service: Orthopedics;  Laterality: Right;    Family History  Problem Relation Age of Onset   Diabetes Mother    Hypertension Mother    Heart disease Mother    Bone cancer Mother    Breast cancer Mother    Hyperthyroidism Sister    Heart attack Sister     Social History   Socioeconomic History   Marital status: Married    Spouse name: Not on file   Number of children: 3   Years of education: Not on file   Highest education level: Not on file  Occupational History   Not on file  Tobacco Use   Smoking status: Former    Types: Cigarettes    Quit  date: 07/27/2009    Years since quitting: 11.5   Smokeless tobacco: Never  Vaping Use   Vaping Use: Former   Quit date: 07/27/2014  Substance and Sexual Activity   Alcohol use: No   Drug use: Never   Sexual activity: Yes  Other Topics Concern   Not on file  Social History Narrative   Not on file   Social Determinants of Health   Financial Resource Strain: Low Risk    Difficulty of Paying Living Expenses: Not hard at all  Food Insecurity: No Food Insecurity    Worried About Programme researcher, broadcasting/film/video in the Last Year: Never true   Ran Out of Food in the Last Year: Never true  Transportation Needs: No Transportation Needs   Lack of Transportation (Medical): No   Lack of Transportation (Non-Medical): No  Physical Activity: Sufficiently Active   Days of Exercise per Week: 7 days   Minutes of Exercise per Session: 30 min  Stress: No Stress Concern Present   Feeling of Stress : Only a little  Social Connections: Press photographer of Communication with Friends and Family: More than three times a week   Frequency of Social Gatherings with Friends and Family: More than three times a week   Attends Religious Services: More than 4 times per year   Active Member of [employer] or Organizations: Yes   Attends Banker Meetings: Never   Marital Status: Married  Catering manager Violence: Not At Risk   Fear of Current or Ex-Partner: No   Emotionally Abused: No   Physically Abused: No   Sexually Abused: No    ROS Review of Systems  Constitutional:  Negative for activity change, appetite change, diaphoresis, fatigue and fever.  Respiratory:  Negative for cough, chest tightness, shortness of breath and wheezing.   Cardiovascular:  Negative for chest pain, palpitations and leg swelling.  Gastrointestinal: Negative.   Neurological:  Negative for dizziness, syncope, weakness, light-headedness, numbness and headaches.  Psychiatric/Behavioral: Negative.     Objective:   Today's Vitals: BP 130/71   Pulse 61   Temp 98.1 F (36.7 C) (Oral)   Ht 5\' 2"  (1.575 m)   Wt 158 lb (71.7 kg)   LMP 12/21/2017 Comment: irregular periods  SpO2 100%   BMI 28.90 kg/m   Physical Exam Vitals and nursing note reviewed.  Constitutional:      General: She is awake. She is not in acute distress.    Appearance: She is well-developed and well-groomed. She is not ill-appearing or toxic-appearing.  HENT:     Head: Normocephalic.     Right Ear: Hearing  normal.     Left Ear: Hearing normal.  Eyes:     General: Lids are normal.        Right eye: No discharge.        Left eye: No discharge.     Conjunctiva/sclera: Conjunctivae normal.     Pupils: Pupils are equal, round, and reactive to light.  Neck:     Thyroid: No thyromegaly.     Vascular: No carotid bruit.  Cardiovascular:     Rate and Rhythm: Normal rate and regular rhythm.     Heart sounds: Normal heart sounds. No murmur heard.   No gallop.  Pulmonary:     Effort: Pulmonary effort is normal. No accessory muscle usage or respiratory distress.     Breath sounds: Normal breath sounds.  Abdominal:     General: Bowel sounds are normal.  Palpations: Abdomen is soft. There is no hepatomegaly or splenomegaly.  Musculoskeletal:     Cervical back: Normal range of motion and neck supple.     Right lower leg: No edema.     Left lower leg: No edema.  Lymphadenopathy:     Head:     Right side of head: No submental, submandibular, tonsillar, preauricular or posterior auricular adenopathy.     Left side of head: No submental, submandibular, tonsillar, preauricular or posterior auricular adenopathy.     Cervical: No cervical adenopathy.  Skin:    General: Skin is warm and dry.  Neurological:     Mental Status: She is alert and oriented to person, place, and time.  Psychiatric:        Attention and Perception: Attention normal.        Mood and Affect: Mood normal.        Speech: Speech normal.        Behavior: Behavior normal. Behavior is cooperative.        Thought Content: Thought content normal.    Assessment & Plan:   Problem List Items Addressed This Visit       Cardiovascular and Mediastinum   Essential hypertension    Chronic, stable.  BP at goal in office and on home readings.  Recommend she monitor BP at least a few mornings a week at home and document.  DASH diet at home.  Continue current medication regimen and adjust as needed.  Labs at physical in March.  Return in  6 months.        Relevant Medications   losartan (COZAAR) 50 MG tablet     Respiratory   Rhinitis, allergic    Chronic, ongoing with intermittent sinus infections reported.  Recommend continued use of OTC antihistamines as needed + albuterol inhaler as needed.  Adjust regimen as needed, consider Singulair in future.         Endocrine   Acquired hypothyroidism    Chronic, stable.  Labs monitored [facility].  Continue current medication regimen as prescribed by them.  Recent notes reviewed and labs.       Relevant Medications   EUTHYROX 112 MCG tablet     Other   S/P parathyroidectomy (HCC)    History of hyperparathyroidism, with parathyroid removed 12/04/2019.  She is followed by endo, Dr. Smith Robertao with [facility], all labs monitored by them there.  Recent notes and labs reviewed.  Continue this collaboration.       Other Visit Diagnoses     Encounter to establish care    -  Primary   Colon cancer screening       Cologuard ordered and discussed today, she is okay with colonoscopy if positive result presents.   Relevant Orders   Cologuard   Encounter for screening mammogram for malignant neoplasm of breast       Mammogram ordered.   Relevant Orders   MM 3D SCREEN BREAST BILATERAL       Outpatient Encounter Medications as of 02/05/2021  Medication Sig   albuterol (VENTOLIN HFA) 108 (90 Base) MCG/ACT inhaler Inhale 2 puffs into the lungs every 6 (six) hours as needed for wheezing or shortness of breath.   cholecalciferol (VITAMIN D3) 25 MCG (1000 UNIT) tablet Take 1,000 Units by mouth daily.   EUTHYROX 112 MCG tablet Take 112 mcg by mouth every morning.   levothyroxine (SYNTHROID, LEVOTHROID) 100 MCG tablet Take 100 mcg by mouth daily before breakfast.  losartan (COZAAR) 50 MG tablet Take 50 mg by mouth daily.   vitamin B-12 (CYANOCOBALAMIN) 500 MCG tablet Take 500 mcg by mouth daily.   ibuprofen (ADVIL) 200 MG tablet Take 400 mg by mouth every 6 (six) hours as  needed for moderate pain.   [DISCONTINUED] Cholecalciferol 25 MCG (1000 UT) tablet Take by mouth. (Patient not taking: Reported on 02/05/2021)   [DISCONTINUED] ferrous sulfate 325 (65 FE) MG tablet Take 325 mg by mouth daily with breakfast. (Patient not taking: Reported on 02/05/2021)   [DISCONTINUED] HYDROcodone-acetaminophen (NORCO) 5-325 MG tablet Take 1-2 tablets by mouth every 4 (four) hours as needed for moderate pain. (Patient not taking: Reported on 02/05/2021)   [DISCONTINUED] losartan (COZAAR) 25 MG tablet Take 25 mg by mouth daily.   [DISCONTINUED] vitamin B-12 (CYANOCOBALAMIN) 500 MCG tablet Take by mouth. (Patient not taking: Reported on 02/05/2021)   No facility-administered encounter medications on file as of 02/05/2021.    Follow-up: Return in about 7 months (around 09/05/2021) for Annual physical.   Marjie Skiff, NP

## 2021-02-05 NOTE — Patient Instructions (Signed)
[facility]  Address: [address], [place], [place] [address]  Phone: [phone]  Mammogram A mammogram is an X-ray of the breasts. This is done to check for changes that are not normal. This test can look for changes that may be caused by breast cancer or other problems. Mammograms are regularly done on women beginning at age 54. A man may have a mammogram if he has a lump or swelling in his breast. Tell a doctor: About any allergies you have. If you have breast implants. If you have had breast disease, biopsy, or surgery. If you have a family history of breast cancer. If you are breastfeeding. Whether you are pregnant or may be pregnant. What are the risks? Generally, this is a safe procedure. But problems may occur, including: Being exposed to radiation. Radiation levels are very low with this test. The need for more tests. The results were not read properly. Trouble finding breast cancer in women with dense breasts. What happens before the test? Have this test done about 1-2 weeks after your menstrual period. This is often when your breasts are the least tender. If you are visiting a new doctor or clinic, have any past mammogram images sent to your new doctor's office. Wash your breasts and under your arms on the day of the test. Do not use deodorants, perfumes, lotions, or powders on the day of the test. Take off any jewelry from your neck. Wear clothes that you can change into and out of easily. What happens during the test?  You will take off your clothes from the waist up. You will put on a gown. You will stand in front of the X-ray machine. Each breast will be placed between two plastic or glass plates. The plates will press down on your breast for a few seconds. Try to relax. This does not cause any harm to your breasts. It may not feel comfortable, but it will be very brief. X-rays will be taken from different angles of each  breast. The procedure may vary among doctors and hospitals. What can I expect after the test? The mammogram will be read by a specialist (radiologist). You may need to do parts of the test again. This depends on the quality of the images. You may go back to your normal activities. It is up to you to get the results of your test. Ask how to get your results when they are ready. Summary A mammogram is an X-ray of the breasts. It looks for changes that may be caused by breast cancer or other problems. A man may have this test if he has a lump or swelling in his breast. Before the test, tell your doctor about any breast problems that you have had in the past. Have this test done about 1-2 weeks after your menstrual period. Ask when your test results will be ready. Make sure you get your test results. This information is not intended to replace advice given to you by your health care provider. Make sure you discuss any questions you have with your health care provider. Document Revised: 04/15/2020 Document Reviewed: 04/15/2020 Elsevier Patient Education  2022 Elsevier Inc.   

## 2021-02-05 NOTE — Assessment & Plan Note (Signed)
History of hyperparathyroidism, with parathyroid removed 12/04/2019.  She is followed by endo, Dr. Smith Robert with [facility], all labs monitored by them there.  Recent notes and labs reviewed.  Continue this collaboration.

## 2021-02-05 NOTE — Assessment & Plan Note (Signed)
Chronic, stable.  Labs monitored by Dr. Smith Robert with endo at [facility].  Continue current medication regimen as prescribed by them.  Recent notes reviewed and labs.

## 2021-02-05 NOTE — Assessment & Plan Note (Signed)
Chronic, ongoing with intermittent sinus infections reported.  Recommend continued use of OTC antihistamines as needed + albuterol inhaler as needed.  Adjust regimen as needed, consider Singulair in future.

## 2021-06-12 ENCOUNTER — Other Ambulatory Visit: Payer: Self-pay

## 2021-06-12 ENCOUNTER — Ambulatory Visit: Payer: Managed Care, Other (non HMO) | Admitting: Nurse Practitioner

## 2021-06-12 ENCOUNTER — Encounter: Payer: Self-pay | Admitting: Nurse Practitioner

## 2021-06-12 DIAGNOSIS — R059 Cough, unspecified: Secondary | ICD-10-CM | POA: Insufficient documentation

## 2021-06-12 DIAGNOSIS — R051 Acute cough: Secondary | ICD-10-CM | POA: Diagnosis not present

## 2021-06-12 MED ORDER — PREDNISONE 20 MG PO TABS: 40.0000 mg | ORAL_TABLET | Freq: Every day | ORAL | 0 refills | Status: AC

## 2021-06-12 MED ORDER — AZITHROMYCIN 250 MG PO TABS: ORAL_TABLET | ORAL | 0 refills | Status: AC

## 2021-06-12 MED ORDER — BENZONATATE 100 MG PO CAPS: 100.0000 mg | ORAL_CAPSULE | Freq: Two times a day (BID) | ORAL | 0 refills | Status: DC | PRN

## 2021-06-12 NOTE — Assessment & Plan Note (Signed)
Ongoing for over one week with no improvement with simple treatment.  Suspect underlying bronchitis with bacterial component at this point.  Sent in Azithromycin and Prednisone for symptoms + Tessalon.  Recommend: - Increased rest - Increasing Fluids - Acetaminophen as needed for fever/pain.  - Salt water gargling, chloraseptic spray and throat lozenges - Mucinex.  Recommend to follow-up if worsening or ongoing.

## 2021-06-12 NOTE — Patient Instructions (Signed)
Acute Bronchitis, Adult ?Acute bronchitis is when air tubes in the lungs (bronchi) suddenly get swollen. The condition can make it hard for you to breathe. In adults, acute bronchitis usually goes away within 2 weeks. A cough caused by bronchitis may last up to 3 weeks. Smoking, allergies, and asthma can make the condition worse. ?What are the causes? ?Germs that cause cold and flu (viruses). The most common cause of this condition is the virus that causes the common cold. ?Bacteria. ?Substances that bother (irritate) the lungs, including: ?Smoke from cigarettes and other types of tobacco. ?Dust and pollen. ?Fumes from chemicals, gases, or burned fuel. ?Indoor or outdoor air pollution. ?What increases the risk? ?A weak body's defense system. This is also called the immune system. ?Any condition that affects your lungs and breathing, such as asthma. ?What are the signs or symptoms? ?A cough. ?Coughing up clear, yellow, or green mucus. ?Making high-pitched whistling sounds when you breathe, most often when you breathe out (wheezing). ?Runny or stuffy nose. ?Having too much mucus in your lungs (chest congestion). ?Shortness of breath. ?Body aches. ?A sore throat. ?How is this treated? ?Acute bronchitis may go away over time without treatment. Your doctor may tell you to: ?Drink more fluids. This will help thin your mucus so it is easier to cough up. ?Use a device that gets medicine into your lungs (inhaler). ?Use a vaporizer or a humidifier. These are machines that add water to the air. This helps with coughing and poor breathing. ?Take a medicine that thins mucus and helps clear it from your lungs. ?Take a medicine that prevents or stops coughing. ?It is not common to take an antibiotic medicine for this condition. ?Follow these instructions at home: ? ?Take over-the-counter and prescription medicines only as told by your doctor. ?Use an inhaler, vaporizer, or humidifier as told by your doctor. ?Take two teaspoons (10  mL) of honey at bedtime. This helps lessen your coughing at night. ?Drink enough fluid to keep your pee (urine) pale yellow. ?Do not smoke or use any products that contain nicotine or tobacco. If you need help quitting, ask your doctor. ?Get a lot of rest. ?Return to your normal activities when your doctor says that it is safe. ?Keep all follow-up visits. ?How is this prevented? ? ?Wash your hands often with soap and water for at least 20 seconds. If you cannot use soap and water, use hand sanitizer. ?Avoid contact with people who have cold symptoms. ?Try not to touch your mouth, nose, or eyes with your hands. ?Avoid breathing in smoke or chemical fumes. ?Make sure to get the flu shot every year. ?Contact a doctor if: ?Your symptoms do not get better in 2 weeks. ?You have trouble coughing up the mucus. ?Your cough keeps you awake at night. ?You have a fever. ?Get help right away if: ?You cough up blood. ?You have chest pain. ?You have very bad shortness of breath. ?You faint or keep feeling like you are going to faint. ?You have a very bad headache. ?Your fever or chills get worse. ?These symptoms may be an emergency. Get help right away. Call your local emergency services (911 in the [place] ?Do not wait to see if the symptoms will go away. ?Do not drive yourself to the hospital. ?Summary ?Acute bronchitis is when air tubes in the lungs (bronchi) suddenly get swollen. In adults, acute bronchitis usually goes away within 2 weeks. ?Drink more fluids. This will help thin your mucus so it is easier to   cough up. ?Take over-the-counter and prescription medicines only as told by your doctor. ?Contact a doctor if your symptoms do not improve after 2 weeks of treatment. ?This information is not intended to replace advice given to you by your health care provider. Make sure you discuss any questions you have with your health care provider. ?Document Revised: 10/16/2020 Document Reviewed: 10/16/2020 ?Elsevier Patient Education  ? 2022 Elsevier Inc. ? ?

## 2021-06-12 NOTE — Progress Notes (Signed)
BP 136/85 (BP Location: Left Arm, Cuff Size: Normal)    Pulse 71    Temp 98.2 F (36.8 C)    Wt 168 lb 3.2 oz (76.3 kg)    LMP 12/21/2017 Comment: irregular periods   SpO2 100%    BMI 30.76 kg/m    Subjective:    Patient ID: Gabrielle Mendoza, female    DOB: [birth year], 55 y.o.   MRN: [id number]  HPI: Gabrielle Mendoza is a 55 y.o. female  Chief Complaint  Patient presents with   URI    Patient states she started having symptoms last week and was taking over the counter medications and was getting better. Patient states Tuesday she woke up having tightness in her chest and she went to the work clinic later that day and she was told it was viral and she was prescribed an inhaler. Patient states she does not feel any better and she states she is now developing a cough and feels like a burning in her deep in her throat. Patient states it feels like almost like acid reflux deep in her thro   UPPER RESPIRATORY TRACT INFECTION Started feeling bad last Thursday with head cold -- took Nyquil Cold and Flu and felt better for 2 days.  She went to clinic at her work, was given inhaler and Mucinex -- went home and rested.  Yesterday used inhaler every 4 hours.  Last night could not sleep due to cough, having burning sensation in chest from cough.  Home Covid testing was negative.  History of smoking years ago. Fever: no Cough: yes Shortness of breath:  in the morning Wheezing: yes Chest pain: no Chest tightness: yes Chest congestion: yes Nasal congestion: yes Runny nose: yes Post nasal drip: yes Sneezing: no Sore throat: yes Swollen glands: no Sinus pressure: yes Headache: yes Face pain: no Toothache: no Ear pain: none Ear pressure: none Eyes red/itching:no Eye drainage/crusting: no  Vomiting: no Rash: no Fatigue: yes Sick contacts: no Strep contacts: no  Context: fluctuating Recurrent sinusitis: no Relief with OTC cold/cough medications: none Treatments attempted:  inhaler,  mucinex, and cough syrup    Relevant past medical, surgical, family and social history reviewed and updated as indicated. Interim medical history since our last visit reviewed. Allergies and medications reviewed and updated.  Review of Systems  Constitutional:  Positive for fatigue. Negative for activity change, appetite change, chills, diaphoresis and fever.  HENT:  Positive for congestion, postnasal drip, rhinorrhea, sinus pressure and sore throat. Negative for ear discharge, ear pain, facial swelling, sinus pain, sneezing and voice change.   Eyes:  Negative for pain and visual disturbance.  Respiratory:  Positive for cough, chest tightness and wheezing. Negative for shortness of breath.   Cardiovascular:  Negative for chest pain, palpitations and leg swelling.  Gastrointestinal: Negative.   Musculoskeletal:  Negative for myalgias.  Neurological: Negative.   Psychiatric/Behavioral: Negative.     Per HPI unless specifically indicated above     Objective:    BP 136/85 (BP Location: Left Arm, Cuff Size: Normal)    Pulse 71    Temp 98.2 F (36.8 C)    Wt 168 lb 3.2 oz (76.3 kg)    LMP 12/21/2017 Comment: irregular periods   SpO2 100%    BMI 30.76 kg/m   Wt Readings from Last 3 Encounters:  06/12/21 168 lb 3.2 oz (76.3 kg)  02/05/21 158 lb (71.7 kg)  07/31/19 180 lb (81.6 kg)    Physical Exam Vitals  and nursing note reviewed.  Constitutional:      General: She is awake. She is not in acute distress.    Appearance: She is well-developed and well-groomed. She is obese. She is not ill-appearing or toxic-appearing.  HENT:     Head: Normocephalic.     Right Ear: Hearing, ear canal and external ear normal. A middle ear effusion is present.     Left Ear: Hearing, ear canal and external ear normal. A middle ear effusion is present.     Nose: Rhinorrhea present. Rhinorrhea is clear.     Right Sinus: No maxillary sinus tenderness or frontal sinus tenderness.     Left Sinus: No maxillary  sinus tenderness or frontal sinus tenderness.     Mouth/Throat:     Mouth: Mucous membranes are moist.     Pharynx: Posterior oropharyngeal erythema (mild with cobblestoning) present. No pharyngeal swelling or oropharyngeal exudate.  Eyes:     General: Lids are normal.        Right eye: No discharge.        Left eye: No discharge.     Conjunctiva/sclera: Conjunctivae normal.     Pupils: Pupils are equal, round, and reactive to light.  Neck:     Thyroid: No thyromegaly.     Vascular: No carotid bruit or JVD.  Cardiovascular:     Rate and Rhythm: Normal rate and regular rhythm.     Heart sounds: Normal heart sounds. No murmur heard.   No gallop.  Pulmonary:     Effort: Pulmonary effort is normal. No accessory muscle usage or respiratory distress.     Breath sounds: Normal breath sounds.  Abdominal:     General: Bowel sounds are normal.     Palpations: Abdomen is soft. There is no hepatomegaly or splenomegaly.  Musculoskeletal:     Cervical back: Normal range of motion and neck supple.     Right lower leg: No edema.     Left lower leg: No edema.  Lymphadenopathy:     Cervical: No cervical adenopathy.  Skin:    General: Skin is warm and dry.  Neurological:     Mental Status: She is alert and oriented to person, place, and time.  Psychiatric:        Attention and Perception: Attention normal.        Mood and Affect: Mood normal.        Speech: Speech normal.        Behavior: Behavior normal. Behavior is cooperative.        Thought Content: Thought content normal.   Results for orders placed or performed during the hospital encounter of 07/28/19  SARS CORONAVIRUS 2 (TAT 6-24 HRS) Nasopharyngeal Nasopharyngeal Swab   Specimen: Nasopharyngeal Swab  Result Value Ref Range   SARS Coronavirus 2 NEGATIVE NEGATIVE      Assessment & Plan:   Problem List Items Addressed This Visit       Other   Cough    Ongoing for over one week with no improvement with simple treatment.   Suspect underlying bronchitis with bacterial component at this point.  Sent in Azithromycin and Prednisone for symptoms + Tessalon.  Recommend: - Increased rest - Increasing Fluids - Acetaminophen as needed for fever/pain.  - Salt water gargling, chloraseptic spray and throat lozenges - Mucinex.  Recommend to follow-up if worsening or ongoing.        Follow up plan: Return if symptoms worsen or fail to improve.

## 2021-06-16 ENCOUNTER — Ambulatory Visit: Payer: Self-pay | Admitting: *Deleted

## 2021-06-16 MED ORDER — HYDROCOD POLST-CPM POLST ER 10-8 MG/5ML PO SUER: 5.0000 mL | Freq: Two times a day (BID) | ORAL | 0 refills | Status: DC | PRN

## 2021-06-16 NOTE — Telephone Encounter (Signed)
Pt called in saying she saw Aura Dials, NP and was given prednisone.    She is still coughing.   I'm having burning and tightness in my chest.  Prednisone is keeping me up and makes me nervous.   I'm coughing bad at night.   Reason for Disposition  [1] Continuous (nonstop) coughing interferes with work or school AND [2] no improvement using cough treatment per Care Advice  Answer Assessment - Initial Assessment Questions 1. ONSET: "When did the cough begin?"      I saw Aura Dials, NP last week and given medication.    I'm still coughing and can't sleep.    I took my last antibiotic this morning and I have 2 more prednisone to go.   I didn't take it this morning because I need some sleep.    It's keeping me awake and I'm coughing.   It's been 3 nights without sleep between coughing and the awakeness.     2. SEVERITY: "How bad is the cough today?"      Worse at night.    It started with a common cold and went into the bronchitis.   I'm stuffy and get a light yellow mucus from my nose.   My chest burns and hurts when I cough.   The cough pills are not helping.    3. SPUTUM: "Describe the color of your sputum" (none, dry cough; clear, white, yellow, green)     Light yellow from nose.   Coughing up clear mucus on occasion but mostly dry. 4. HEMOPTYSIS: "Are you coughing up any blood?" If so ask: "How much?" (flecks, streaks, tablespoons, etc.)     No 5. DIFFICULTY BREATHING: "Are you having difficulty breathing?" If Yes, ask: "How bad is it?" (e.g., mild, moderate, severe)    - MILD: No SOB at rest, mild SOB with walking, speaks normally in sentences, can lie down, no retractions, pulse < 100.    - MODERATE: SOB at rest, SOB with minimal exertion and prefers to sit, cannot lie down flat, speaks in phrases, mild retractions, audible wheezing, pulse 100-120.    - SEVERE: Very SOB at rest, speaks in single words, struggling to breathe, sitting hunched forward, retractions, pulse > 120      My  chest burns and hurts with coughing but no shortness of breath. 6. FEVER: "Do you have a fever?" If Yes, ask: "What is your temperature, how was it measured, and when did it start?"     No 7. CARDIAC HISTORY: "Do you have any history of heart disease?" (e.g., heart attack, congestive heart failure)      Not asked 8. LUNG HISTORY: "Do you have any history of lung disease?"  (e.g., pulmonary embolus, asthma, emphysema)     Not asked since seen recently for same issue.   Do I need another antibiotic?    Does she recommend anything else?    9. PE RISK FACTORS: "Do you have a history of blood clots?" (or: recent major surgery, recent prolonged travel, bedridden)     Not asked 10. OTHER SYMPTOMS: "Do you have any other symptoms?" (e.g., runny nose, wheezing, chest pain)       No fever, no shortness of breath.   Scratchy throat is gone.   Just the coughing especially at night and the chest burning with a little tightness even with the prednisone and antibiotic and cough medicine. 11. PREGNANCY: "Is there any chance you are pregnant?" "When was your last menstrual period?"  Not asked 12. TRAVEL: "Have you traveled out of the country in the last month?" (e.g., travel history, exposures)       Not asked  Protocols used: Cough - Acute Non-Productive-A-AH

## 2021-06-16 NOTE — Telephone Encounter (Signed)
This encounter was created in error - please disregard.  Patient reports she already spoke to a nurse.

## 2021-06-16 NOTE — Telephone Encounter (Signed)
Patient was notified and made aware of Jolene's recommendations and verbalized understanding.

## 2021-06-16 NOTE — Addendum Note (Signed)
Addended by: Aura Dials T on: 06/16/2021 12:20 PM   Modules accepted: Orders

## 2021-06-16 NOTE — Telephone Encounter (Signed)
°  Chief Complaint: continued coughing especially at night. Symptoms: mostly dry cough worse at night.   Prednisone is keeping her awake along with the coughing.  No sleep for last 3 nights.  Prednisone making her nervous too. Frequency: every night/day Pertinent Negatives: Patient denies shortness of breath but is having chest burning with coughing.   No fever.   Other symptoms are much better except the cough Disposition: [] ED /[] Urgent Care (no appt availability in office) / [x] Appointment(In office/virtual)/ []  [facility]/ [] Home Care/ [] Refused Recommended Disposition  Additional Notes She has been seen for this and has completed the antibiotic.   The prednisone is keeping her up and making her unable to sleep.   Tessilon Perles not helping the cough.     She is requesting Jolene call in something else or recommend something else she can take for the coughing.    I sent her request to [facility].

## 2021-06-25 ENCOUNTER — Ambulatory Visit
Admission: RE | Admit: 2021-06-25 | Discharge: 2021-06-25 | Disposition: A | Discharge status: Home / Self Care | Payer: Managed Care, Other (non HMO) | Source: Ambulatory Visit | Attending: Nurse Practitioner | Admitting: Nurse Practitioner

## 2021-06-25 ENCOUNTER — Other Ambulatory Visit: Payer: Self-pay

## 2021-06-25 DIAGNOSIS — Z1231 Encounter for screening mammogram for malignant neoplasm of breast: Secondary | ICD-10-CM | POA: Diagnosis present

## 2021-06-26 NOTE — Progress Notes (Signed)
Contacted via MyChart   Normal mammogram, repeat in one year.:)

## 2021-07-17 ENCOUNTER — Encounter: Payer: Self-pay | Admitting: Nurse Practitioner

## 2021-07-18 ENCOUNTER — Ambulatory Visit: Payer: Managed Care, Other (non HMO) | Admitting: Nurse Practitioner

## 2021-07-18 ENCOUNTER — Encounter: Payer: Self-pay | Admitting: Nurse Practitioner

## 2021-07-18 ENCOUNTER — Other Ambulatory Visit: Payer: Self-pay

## 2021-07-18 VITALS — BP 103/70 | HR 98 | Temp 97.8°F | Wt 167.4 lb

## 2021-07-18 DIAGNOSIS — R8281 Pyuria: Secondary | ICD-10-CM | POA: Diagnosis not present

## 2021-07-18 DIAGNOSIS — N760 Acute vaginitis: Secondary | ICD-10-CM

## 2021-07-18 DIAGNOSIS — B9689 Other specified bacterial agents as the cause of diseases classified elsewhere: Secondary | ICD-10-CM | POA: Diagnosis not present

## 2021-07-18 DIAGNOSIS — R399 Unspecified symptoms and signs involving the genitourinary system: Secondary | ICD-10-CM | POA: Insufficient documentation

## 2021-07-18 LAB — MICROSCOPIC EXAMINATION: RBC, Urine: NONE SEEN /hpf (ref 0–2)

## 2021-07-18 LAB — URINALYSIS, ROUTINE W REFLEX MICROSCOPIC
Bilirubin, UA: NEGATIVE
Glucose, UA: NEGATIVE
Ketones, UA: NEGATIVE
Nitrite, UA: POSITIVE — AB
Protein,UA: NEGATIVE
RBC, UA: NEGATIVE
Specific Gravity, UA: 1.01 (ref 1.005–1.030)
Urobilinogen, Ur: 0.2 mg/dL (ref 0.2–1.0)
pH, UA: 6.5 (ref 5.0–7.5)

## 2021-07-18 LAB — WET PREP FOR TRICH, YEAST, CLUE
Clue Cell Exam: POSITIVE — AB
Trichomonas Exam: NEGATIVE
Yeast Exam: NEGATIVE

## 2021-07-18 MED ORDER — METRONIDAZOLE 500 MG PO TABS: 500.0000 mg | ORAL_TABLET | Freq: Two times a day (BID) | ORAL | 0 refills | Status: AC

## 2021-07-18 MED ORDER — NITROFURANTOIN MONOHYD MACRO 100 MG PO CAPS: 100.0000 mg | ORAL_CAPSULE | Freq: Two times a day (BID) | ORAL | 0 refills | Status: AC

## 2021-07-18 NOTE — Assessment & Plan Note (Signed)
Acute for one week with UA noting +nitrites, + leuks, few bacteria.  Wet prep + clue cells.  Will send urine for culture, as this time start Macrobid 100 MG BID x 5 days and adjust as needed based on culture results.  Educated patient on this and provided her information on pelvic floor exercises to perform at home.

## 2021-07-18 NOTE — Assessment & Plan Note (Signed)
Wet prep + clue cells, negative yeast and trich.  Educated patient on this finding and treatment for this.  Start Flagyl BID x 7 days, to take with probiotic yogurt to avoid GI upset.  Printed out information to her on diagnosis.

## 2021-07-18 NOTE — Patient Instructions (Signed)

## 2021-07-18 NOTE — Progress Notes (Signed)
BP 103/70    Pulse 98    Temp 97.8 F (36.6 C) (Oral)    Wt 167 lb 6.4 oz (75.9 kg)    LMP 12/21/2017 Comment: irregular periods   SpO2 96%    BMI 30.62 kg/m    Subjective:    Patient ID: Gabrielle Mendoza, female    DOB: [birth year], 56 y.o.   MRN: [id number]  HPI: Gabrielle Mendoza is a 56 y.o. female  Chief Complaint  Patient presents with   Urinary Tract Infection    Patient states she noticed it last Friday late at night. Patient states she started treating the infection with Vitamin C and it took the burning with urination away. Patient states she then picked up Azo medication to help with it. Patient states she is still having a small amount of pressure and tingling feeling. Patient states the burning is completely gone.    URINARY SYMPTOMS Started with symptoms last Friday night -- with dysuria.  Started taking Vitamin C and next day burning was gone, but has had some tingling sensation + pressure. Dysuria:  no further Urinary frequency: no Urgency: yes Small volume voids: no Symptom severity: yes Urinary incontinence: no Foul odor: no Hematuria: no Abdominal pain: no Back pain: no Suprapubic pain/pressure: yes Flank pain: no Fever:  no Vomiting: no Relief with cranberry juice:  none taken Relief with pyridium: no -- took for 2 days Status: stable Previous urinary tract infection: yes Recurrent urinary tract infection: no Sexual activity: monogamous History of sexually transmitted disease:  Treatments attempted: pyridium and increasing fluids    Relevant past medical, surgical, family and social history reviewed and updated as indicated. Interim medical history since our last visit reviewed. Allergies and medications reviewed and updated.  Review of Systems  Constitutional:  Negative for activity change, appetite change, diaphoresis, fatigue and fever.  Respiratory:  Negative for cough, chest tightness and shortness of breath.   Cardiovascular:  Negative for  chest pain, palpitations and leg swelling.  Gastrointestinal: Negative.   Genitourinary:  Positive for dysuria and urgency. Negative for decreased urine volume, flank pain, frequency, hematuria and pelvic pain.  Neurological: Negative.   Psychiatric/Behavioral: Negative.     Per HPI unless specifically indicated above     Objective:    BP 103/70    Pulse 98    Temp 97.8 F (36.6 C) (Oral)    Wt 167 lb 6.4 oz (75.9 kg)    LMP 12/21/2017 Comment: irregular periods   SpO2 96%    BMI 30.62 kg/m   Wt Readings from Last 3 Encounters:  07/18/21 167 lb 6.4 oz (75.9 kg)  06/12/21 168 lb 3.2 oz (76.3 kg)  02/05/21 158 lb (71.7 kg)    Physical Exam Vitals and nursing note reviewed.  Constitutional:      General: She is awake. She is not in acute distress.    Appearance: She is well-developed and well-groomed. She is obese. She is not ill-appearing or toxic-appearing.  HENT:     Head: Normocephalic.     Right Ear: Hearing normal.     Left Ear: Hearing normal.  Eyes:     General: Lids are normal.        Right eye: No discharge.        Left eye: No discharge.     Conjunctiva/sclera: Conjunctivae normal.     Pupils: Pupils are equal, round, and reactive to light.  Neck:     Thyroid: No thyromegaly.  Vascular: No carotid bruit.  Cardiovascular:     Rate and Rhythm: Normal rate and regular rhythm.     Heart sounds: Normal heart sounds. No murmur heard.   No gallop.  Pulmonary:     Effort: Pulmonary effort is normal. No accessory muscle usage or respiratory distress.     Breath sounds: Normal breath sounds.  Abdominal:     General: Bowel sounds are normal. There is no distension.     Palpations: Abdomen is soft.     Tenderness: There is abdominal tenderness in the suprapubic area. There is no right CVA tenderness or left CVA tenderness.  Musculoskeletal:     Cervical back: Normal range of motion and neck supple.     Right lower leg: No edema.     Left lower leg: No edema.   Skin:    General: Skin is warm and dry.  Neurological:     Mental Status: She is alert and oriented to person, place, and time.  Psychiatric:        Attention and Perception: Attention normal.        Mood and Affect: Mood normal.        Speech: Speech normal.        Behavior: Behavior normal. Behavior is cooperative.        Thought Content: Thought content normal.    Results for orders placed or performed during the hospital encounter of 07/28/19  SARS CORONAVIRUS 2 (TAT 6-24 HRS) Nasopharyngeal Nasopharyngeal Swab   Specimen: Nasopharyngeal Swab  Result Value Ref Range   SARS Coronavirus 2 NEGATIVE NEGATIVE      Assessment & Plan:   Problem List Items Addressed This Visit       Genitourinary   Bacterial vaginosis    Wet prep + clue cells, negative yeast and trich.  Educated patient on this finding and treatment for this.  Start Flagyl BID x 7 days, to take with probiotic yogurt to avoid GI upset.  Printed out information to her on diagnosis.      Relevant Medications   metroNIDAZOLE (FLAGYL) 500 MG tablet   nitrofurantoin, macrocrystal-monohydrate, (MACROBID) 100 MG capsule     Other   Urinary symptom or sign - Primary    Acute for one week with UA noting +nitrites, + leuks, few bacteria.  Wet prep + clue cells.  Will send urine for culture, as this time start Macrobid 100 MG BID x 5 days and adjust as needed based on culture results.  Educated patient on this and provided her information on pelvic floor exercises to perform at home.        Relevant Orders   WET PREP FOR [place], YEAST, CLUE   Urinalysis, Routine w reflex microscopic   Other Visit Diagnoses     Pyuria       Urine for culture   Relevant Orders   Urine Culture        Follow up plan: Return if symptoms worsen or fail to improve.

## 2021-07-21 ENCOUNTER — Ambulatory Visit: Payer: Self-pay

## 2021-07-21 NOTE — Progress Notes (Signed)
Contacted via MyChart   Urine infection is susceptible to Macrobid, good news, continue this until it is complete.  Have a great day!!

## 2021-07-21 NOTE — Telephone Encounter (Signed)
Pt stated she has a possible ear infection. Pt stated she started with an earache on left ear Saturday. Pt stated she feels like something is in her ear.    Offered pt an appointment for tomorrow; pt declined, wanting something today. Pt asked that I send a message asking for someone to call her back with clinical advice instead.    Chief Complaint: Left ear pain Symptoms: Above Frequency: Started Saturday Pertinent Negatives: Patient denies any other symptoms Disposition: [] ED /[] Urgent Care (no appt availability in office) / [] Appointment(In office/virtual)/ []  [facility]/ [] Home Care/ [] Refused Recommended Disposition /[] [facility]/ []  Follow-up with PCP Additional Notes:    Reason for Disposition  Earache  (Exceptions: brief ear pain of < 60 minutes duration, earache occurring during air travel  Answer Assessment - Initial Assessment Questions 1. LOCATION: "Which ear is involved?"     Left 2. ONSET: "When did the ear start hurting"      Saturday 3. SEVERITY: "How bad is the pain?"  (Scale 1-10; mild, moderate or severe)   - MILD (1-3): doesn't interfere with normal activities    - MODERATE (4-7): interferes with normal activities or awakens from sleep    - SEVERE (8-10): excruciating pain, unable to do any normal activities      8 4. URI SYMPTOMS: "Do you have a runny nose or cough?"     No 5. FEVER: "Do you have a fever?" If Yes, ask: "What is your temperature, how was it measured, and when did it start?"     No 6. CAUSE: "Have you been swimming recently?", "How often do you use Q-TIPS?", "Have you had any recent air travel or scuba diving?"     Unsure 7. OTHER SYMPTOMS: "Do you have any other symptoms?" (e.g., headache, stiff neck, dizziness, vomiting, runny nose, decreased hearing)     No 8. PREGNANCY: "Is there any chance you are pregnant?" "When was your last menstrual period?"     No  Protocols used: 

## 2021-07-22 ENCOUNTER — Encounter: Payer: Self-pay | Admitting: Nurse Practitioner

## 2021-07-22 ENCOUNTER — Other Ambulatory Visit: Payer: Self-pay

## 2021-07-22 ENCOUNTER — Ambulatory Visit: Payer: Managed Care, Other (non HMO) | Admitting: Nurse Practitioner

## 2021-07-22 DIAGNOSIS — H9202 Otalgia, left ear: Secondary | ICD-10-CM | POA: Diagnosis not present

## 2021-07-22 NOTE — Assessment & Plan Note (Signed)
Acute x 4 days, had URI last in December.  Overall no infection noted on exam, mild effusion, suspect from recent URI.  She prefers not to use Prednisone.  Recommend she start Claritin 10 MG daily to help with leftover drainage + Flonase at night.  Humidifier in room at night, since uses fan which causes increased congestion in morning.  Return to office as needed  For worsening or ongoing.

## 2021-07-22 NOTE — Progress Notes (Signed)
BP 134/80    Pulse 72    Temp 97.7 F (36.5 C) (Oral)    Ht 5\' 2"  (1.575 m)    Wt 167 lb (75.8 kg)    LMP 12/21/2017 Comment: irregular periods   SpO2 99%    BMI 30.54 kg/m    Subjective:    Patient ID: 12/23/2017, female    DOB: July 21, 1965, 56 y.o.   MRN: [id number]  HPI: Gabrielle Mendoza is a 56 y.o. female  Chief Complaint  Patient presents with   Ear Pain    Patient states late Saturday night she noticed ear pain in her L ear and it was a sharp pain. Patient states Sunday morning she noticed she was cold and having pain in bone in the back of the ear. Patient denies having any fever or chills. Patient states it feels like it was heavier and says it was just an earache. Patient states she feels better and she only came in because she felt the discomfort in the L ear last night. Patient states she always wakes up with a stuffy nose.    EAR PAIN Started Saturday night -- left ear sharp pain, eases off in afternoon.  In middle of night she woke-up and ear was throbbing.  This morning it felt like pressure. Duration: days Involved ear(s): left Severity:  7/10 at worst with shooting pain, now it is annoying discomfort Quality:  sharp initially Fever: no Otorrhea: no Upper respiratory infection symptoms: yes -- in December had bronchitis Pruritus: no Hearing loss: no Water immersion no Using Q-tips: no Recurrent otitis media: no Status: stable Treatments attempted: Peroxide in it on Sunday   Relevant past medical, surgical, family and social history reviewed and updated as indicated. Interim medical history since our last visit reviewed. Allergies and medications reviewed and updated.  Review of Systems  Constitutional:  Negative for activity change, appetite change, diaphoresis, fatigue and fever.  HENT:  Positive for ear pain. Negative for ear discharge.   Respiratory:  Negative for cough, chest tightness, shortness of breath and wheezing.   Cardiovascular:  Negative  for chest pain, palpitations and leg swelling.  Gastrointestinal: Negative.   Neurological: Negative.   Psychiatric/Behavioral: Negative.     Per HPI unless specifically indicated above     Objective:    BP 134/80    Pulse 72    Temp 97.7 F (36.5 C) (Oral)    Ht 5\' 2"  (1.575 m)    Wt 167 lb (75.8 kg)    LMP 12/21/2017 Comment: irregular periods   SpO2 99%    BMI 30.54 kg/m   Wt Readings from Last 3 Encounters:  07/22/21 167 lb (75.8 kg)  07/18/21 167 lb 6.4 oz (75.9 kg)  06/12/21 168 lb 3.2 oz (76.3 kg)    Physical Exam Vitals and nursing note reviewed.  Constitutional:      General: She is awake. She is not in acute distress.    Appearance: She is well-developed and well-groomed. She is not ill-appearing or toxic-appearing.  HENT:     Head: Normocephalic.     Right Ear: Hearing, tympanic membrane, ear canal and external ear normal.     Left Ear: Hearing, ear canal and external ear normal. No tenderness. A middle ear effusion is present. Tympanic membrane is not injected, perforated or erythematous.     Nose: Nose normal.     Right Sinus: No maxillary sinus tenderness or frontal sinus tenderness.     Left Sinus:  No maxillary sinus tenderness or frontal sinus tenderness.     Mouth/Throat:     Mouth: Mucous membranes are moist.     Pharynx: Oropharynx is clear.  Eyes:     General: Lids are normal.        Right eye: No discharge.        Left eye: No discharge.     Conjunctiva/sclera: Conjunctivae normal.     Pupils: Pupils are equal, round, and reactive to light.  Neck:     Thyroid: No thyromegaly.     Vascular: No carotid bruit.  Cardiovascular:     Rate and Rhythm: Normal rate and regular rhythm.     Heart sounds: Normal heart sounds. No murmur heard.   No gallop.  Pulmonary:     Effort: Pulmonary effort is normal. No accessory muscle usage or respiratory distress.     Breath sounds: Normal breath sounds.  Abdominal:     General: Bowel sounds are normal.      Palpations: Abdomen is soft. There is no hepatomegaly or splenomegaly.  Musculoskeletal:     Cervical back: Normal range of motion and neck supple.     Right lower leg: No edema.     Left lower leg: No edema.  Lymphadenopathy:     Head:     Left side of head: No submandibular adenopathy.     Cervical: No cervical adenopathy.  Skin:    General: Skin is warm and dry.  Neurological:     Mental Status: She is alert and oriented to person, place, and time.  Psychiatric:        Attention and Perception: Attention normal.        Mood and Affect: Mood normal.        Speech: Speech normal.        Behavior: Behavior normal. Behavior is cooperative.        Thought Content: Thought content normal.   Results for orders placed or performed in visit on 07/18/21  Urine Culture   Specimen: Vein; Urine   UC  Result Value Ref Range   Urine Culture, Routine Preliminary report (A)    Organism ID, Bacteria Escherichia coli (A)    ORGANISM ID, BACTERIA Comment    Antimicrobial Susceptibility Comment   Microscopic Examination  Result Value Ref Range   WBC, UA 6-10 (A) 0 - 5 /hpf   RBC None seen 0 - 2 /hpf   Epithelial Cells (non renal) 0-10 0 - 10 /hpf   Bacteria, UA Few (A) None seen/Few  WET PREP FOR TRICH, YEAST, CLUE   Urine  Result Value Ref Range   Trichomonas Exam Negative Negative   Yeast Exam Negative Negative   Clue Cell Exam Positive (A) Negative  Urinalysis, Routine w reflex microscopic  Result Value Ref Range   Specific Gravity, UA 1.010 1.005 - 1.030   pH, UA 6.5 5.0 - 7.5   Color, UA Yellow Yellow   Appearance Ur Clear Clear   Leukocytes,UA Trace (A) Negative   Protein,UA Negative Negative/Trace   Glucose, UA Negative Negative   Ketones, UA Negative Negative   RBC, UA Negative Negative   Bilirubin, UA Negative Negative   Urobilinogen, Ur 0.2 0.2 - 1.0 mg/dL   Nitrite, UA Positive (A) Negative   Microscopic Examination See below:       Assessment & Plan:   Problem  List Items Addressed This Visit       Other   Ear pain, left  Acute x 4 days, had URI last in December.  Overall no infection noted on exam, mild effusion, suspect from recent URI.  She prefers not to use Prednisone.  Recommend she start Claritin 10 MG daily to help with leftover drainage + Flonase at night.  Humidifier in room at night, since uses fan which causes increased congestion in morning.  Return to office as needed  For worsening or ongoing.        Follow up plan: Return if symptoms worsen or fail to improve.

## 2021-07-22 NOTE — Patient Instructions (Signed)
Earache, Adult An earache, or ear pain, can be caused by many things, including: An infection. Ear wax buildup. Ear pressure. Something in the ear that should not be there (foreign body). A sore throat. Tooth problems. Jaw problems. Treatment of the earache will depend on the cause. If the cause is not clear or cannot be determined, you may need to watch your symptoms until your earache goes away or until a cause is found. Follow these instructions at home: Medicines Take or apply over-the-counter and prescription medicines only as told by your health care provider. If you were prescribed an antibiotic medicine, use it as told by your health care provider. Do not stop using the antibiotic even if you start to feel better. Do not put anything in your ear other than medicine that is prescribed by your health care provider. Managing pain If directed, apply heat to the affected area as often as told by your health care provider. Use the heat source that your health care provider recommends, such as a moist heat pack or a heating pad. Place a towel between your skin and the heat source. Leave the heat on for 20-30 minutes. Remove the heat if your skin turns bright red. This is especially important if you are unable to feel pain, heat, or cold. You may have a greater risk of getting burned. If directed, put ice on the affected area as often as told by your health care provider. To do this:   Put ice in a plastic bag. Place a towel between your skin and the bag. Leave the ice on for 20 minutes, 2-3 times a day. General instructions Pay attention to any changes in your symptoms. Try resting in an upright position instead of lying down. This may help to reduce pressure in your ear and relieve pain. Chew gum if it helps to relieve your ear pain. Treat any allergies as told by your health care provider. Drink enough fluid to keep your urine pale yellow. It is up to you to get the results of any  tests that were done. Ask your health care provider, or the department that is doing the tests, when your results will be ready. Keep all follow-up visits as told by your health care provider. This is important. Contact a health care provider if: Your pain does not improve within 2 days. Your earache gets worse. You have new symptoms. You have a fever. Get help right away if you: Have a severe headache. Have a stiff neck. Have trouble swallowing. Have redness or swelling behind your ear. Have fluid or blood coming from your ear. Have hearing loss. Feel dizzy. Summary An earache, or ear pain, can be caused by many things. Treatment of the earache will depend on the cause. Follow recommendations from your health care provider to treat your ear pain. If the cause is not clear or cannot be determined, you may need to watch your symptoms until your earache goes away or until a cause is found. Keep all follow-up visits as told by your health care provider. This is important. This information is not intended to replace advice given to you by your health care provider. Make sure you discuss any questions you have with your health care provider. Document Revised: 01/21/2019 Document Reviewed: 01/21/2019 Elsevier Patient Education  2022 Elsevier Inc.  

## 2021-07-23 LAB — URINE CULTURE

## 2021-09-05 ENCOUNTER — Ambulatory Visit: Payer: Managed Care, Other (non HMO) | Admitting: Nurse Practitioner

## 2021-09-14 DIAGNOSIS — E538 Deficiency of other specified B group vitamins: Secondary | ICD-10-CM | POA: Insufficient documentation

## 2021-09-14 DIAGNOSIS — Z6741 Type O blood, Rh negative: Secondary | ICD-10-CM | POA: Insufficient documentation

## 2021-09-14 NOTE — Patient Instructions (Signed)
Osteopenia ?Osteopenia is a loss of thickness (density) inside the bones. Another name for osteopenia is low bone mass. Mild osteopenia is a normal part of aging. It is not a disease, and it does not cause symptoms. ?However, if you have osteopenia and continue to lose bone mass, you could develop a condition that causes the bones to become thin and break more easily (osteoporosis). Osteoporosis can cause you to lose some height, have back pain, and have a stooped posture. Although osteopenia is not a disease, making changes to your lifestyle and diet can help to prevent osteopenia from developing into osteoporosis. ?What are the causes? ?Osteopenia is caused by loss of calcium in the bones. Bones are constantly changing. Old bone cells are continually being replaced with new bone cells. This process builds new bone. ?The mineral calcium is needed to build new bone and maintain bone density. Bone density is usually highest around age 35. After that, most people's bodies cannot replace all the bone they have lost with new bone. ?What increases the risk? ?You are more likely to develop this condition if: ?You are older than age 50. ?You are a woman who went through menopause early. ?You have a long illness that keeps you in bed. ?You do not get enough exercise. ?You lack certain nutrients (malnutrition). ?You have an overactive thyroid gland (hyperthyroidism). ?You use products that contain nicotine or tobacco, such as cigarettes, e-cigarettes and chewing tobacco, or you drink a lot of alcohol. ?You are taking medicines that weaken the bones, such as steroids. ?What are the signs or symptoms? ?This condition does not cause any symptoms. You may have a slightly higher risk for bone breaks (fractures), so getting fractures more easily than normal may be an indication of osteopenia. ?How is this diagnosed? ?This condition may be diagnosed based on an X-ray exam that measures bone density (dual-energy X-ray  absorptiometry, or DEXA). This test can measure bone density in your hips, spine, and wrists. ?Osteopenia has no symptoms, so this condition is usually diagnosed after a routine bone density screening test is done for osteoporosis. This routine screening is usually done for: ?Women who are age 65 or older. ?Men who are age 70 or older. ?If you have risk factors for osteopenia, you may have the screening test at an earlier age. ?How is this treated? ?Making dietary and lifestyle changes can lower your risk for osteoporosis. ?If you have severe osteopenia that is close to becoming osteoporosis, this condition can be treated with medicines and dietary supplements such as calcium and vitamin D. These supplements help to rebuild bone density. ?Follow these instructions at home: ?Eating and drinking ?Eat a diet that is high in calcium and vitamin D. ?Calcium is found in dairy products, beans, salmon, and leafy green vegetables like spinach and broccoli. ?Look for foods that have vitamin D and calcium added to them (fortified foods), such as orange juice, cereal, and bread. ? ?Lifestyle ?Do 30 minutes or more of a weight-bearing exercise every day, such as walking, jogging, or playing a sport. These types of exercises strengthen the bones. ?Do not use any products that contain nicotine or tobacco, such as cigarettes, e-cigarettes, and chewing tobacco. If you need help quitting, ask your health care provider. ?Do not drink alcohol if: ?Your health care provider tells you not to drink. ?You are pregnant, may be pregnant, or are planning to become pregnant. ?If you drink alcohol: ?Limit how much you use to: ?0-1 drink a day for women. ?0-2 drinks   a day for men. ?Be aware of how much alcohol is in your drink. In the [place] one drink equals one 12 oz bottle of beer (355 mL), one 5 oz glass of wine (148 mL), or one 1? oz glass of hard liquor (44 mL). ?General instructions ?Take over-the-counter and prescription medicines only as  told by your health care provider. These include vitamins and supplements. ?Take precautions at home to lower your risk of falling, such as: ?Keeping rooms well-lit and free of clutter, such as cords. ?Installing safety rails on stairs. ?Using rubber mats in the bathroom or other areas that are often wet or slippery. ?Keep all follow-up visits. This is important. ?Contact a health care provider if: ?You have not had a bone density screening for osteoporosis and you are: ?A woman who is age 65 or older. ?A man who is age 70 or older. ?You are a postmenopausal woman who has not had a bone density screening for osteoporosis. ?You are older than age 50 and you want to know if you should have bone density screening for osteoporosis. ?Summary ?Osteopenia is a loss of thickness (density) inside the bones. Another name for osteopenia is low bone mass. ?Osteopenia is not a disease, but it may increase your risk for a condition that causes the bones to become thin and break more easily (osteoporosis). ?You may be at risk for osteopenia if you are older than age 50 or if you are a woman who went through early menopause. ?Osteopenia does not cause any symptoms, but it can be diagnosed with a bone density screening test. ?Dietary and lifestyle changes are the first treatment for osteopenia. These may lower your risk for osteoporosis. ?This information is not intended to replace advice given to you by your health care provider. Make sure you discuss any questions you have with your health care provider. ?Document Revised: 11/30/2019 Document Reviewed: 11/30/2019 ?Elsevier Patient Education ? 2022 Elsevier Inc. ? ?

## 2021-09-17 ENCOUNTER — Ambulatory Visit: Payer: Managed Care, Other (non HMO) | Admitting: Nurse Practitioner

## 2021-09-17 ENCOUNTER — Encounter: Payer: Self-pay | Admitting: Nurse Practitioner

## 2021-09-17 ENCOUNTER — Other Ambulatory Visit: Payer: Self-pay

## 2021-09-17 VITALS — BP 125/77 | HR 70 | Temp 97.6°F | Ht 62.0 in | Wt 165.8 lb

## 2021-09-17 DIAGNOSIS — Z862 Personal history of diseases of the blood and blood-forming organs and certain disorders involving the immune mechanism: Secondary | ICD-10-CM

## 2021-09-17 DIAGNOSIS — E538 Deficiency of other specified B group vitamins: Secondary | ICD-10-CM

## 2021-09-17 DIAGNOSIS — R922 Inconclusive mammogram: Secondary | ICD-10-CM

## 2021-09-17 DIAGNOSIS — K582 Mixed irritable bowel syndrome: Secondary | ICD-10-CM | POA: Insufficient documentation

## 2021-09-17 DIAGNOSIS — M85832 Other specified disorders of bone density and structure, left forearm: Secondary | ICD-10-CM

## 2021-09-17 DIAGNOSIS — E039 Hypothyroidism, unspecified: Secondary | ICD-10-CM

## 2021-09-17 DIAGNOSIS — Z0000 Encounter for general adult medical examination without abnormal findings: Secondary | ICD-10-CM | POA: Diagnosis not present

## 2021-09-17 DIAGNOSIS — I10 Essential (primary) hypertension: Secondary | ICD-10-CM | POA: Diagnosis not present

## 2021-09-17 DIAGNOSIS — E892 Postprocedural hypoparathyroidism: Secondary | ICD-10-CM

## 2021-09-17 NOTE — Assessment & Plan Note (Addendum)
Patient states that she is not having regular bowel movements, she sometimes goes 3 days without any BM. Also experiences occasional diarrhea. Recommend that she start daily fiber supplementations such as Metamucil. Notify provider if no improvement or worsens.  ?

## 2021-09-17 NOTE — Assessment & Plan Note (Signed)
Ordered CBC with diff,Ferritin and TIBC today. ?

## 2021-09-17 NOTE — Assessment & Plan Note (Signed)
History of hyperparathyroidism, with parathyroid removed 12/04/2019.  She is followed by endo, schedule to see Dr. Janese Banks with [facility] in June 2023, all labs monitored by them there. Recent notes and labs reviewed.  Continue this collaboration. ?

## 2021-09-17 NOTE — Assessment & Plan Note (Signed)
Patient is taking B12 Supplement. Continue with this regimen. Order B12 level today.   ?

## 2021-09-17 NOTE — Progress Notes (Signed)
? ?BP 125/77   Pulse 70   Temp 97.6 ?F (36.4 ?C) (Oral)   Ht 5\' 2"  (1.575 m)   Wt 165 lb 12.8 oz (75.2 kg)   LMP 12/21/2017 Comment: irregular periods  SpO2 99%   BMI 30.33 kg/m?   ? ?NOTE WRITTEN BY [employer] DNP STUDENT.  ASSESSMENT AND PLAN OF CARE REVIEWED WITH STUDENT, AGREE WITH ABOVE FINDINGS AND PLAN.  ? ?Subjective:  ? ? Patient ID: Gabrielle Mendoza, female    DOB: 1966/01/04, 56 y.o.   MRN: [id number] ? ?HPI: ?Gabrielle Mendoza is a 56 y.o. female presenting on 09/17/2021 for comprehensive medical examination. Current medical complaints include:  ?concerns about dense breast tissue on mammogram result (pt's mother had cancer) patient wants to know if there are other screening tests available. ?Concerns about irregular bowel movement (constipation & diarrhea) ? ?She currently lives with: husband ?Menopausal Symptoms: no ? ?HYPOTHYROIDISM ?Continues on Levothyroxine 112 MCG daily.  Last TSH check on 12/11/20 with endo = TSH 6.76 and Free T4 1.27 + B12 level 310. ?  ?Had parathyroid surgery on the 12/04/2019.  She follows with Dr. 02/03/2020, endo, at [facility] -- they monitor all thyroid and parathyroid labs.  Last visit was in June 2022. See endo every 6 mos to 1 year next in June. ?Thyroid control status:stable ?Satisfied with current treatment? yes ?Medication side effects: no ?Medication compliance: good compliance ?Etiology of hypothyroidism:  ?Recent dose adjustment:no ?Fatigue: no ?Cold intolerance: no ?Heat intolerance: no ?Weight gain: no ?Weight loss: no ?Constipation: no ?Diarrhea/loose stools: no ?Palpitations: no ?Lower extremity edema: no ?Anxiety/depressed mood: no  ?  ?HYPERTENSION ?Continues on Losartan 50 MG daily, has been on this for > one year. History of smoking -- smoked for 10 to 15 years, smoked 1 PPD, quit 10 years ago.  Family history of cardiac disease -- mother and sister. ?  ?Does have seasonal asthma reported, more noted when hot outside -- uses Albuterol as needed, minimally  uses. ?Hypertension status: stable  ?Satisfied with current treatment? yes ?Duration of hypertension: chronic ?BP monitoring frequency:  a few times a month ?BP range: <130/80 at home ?BP medication side effects:  no ?Medication compliance: good compliance ?Previous BP meds:Losartan ?Aspirin: no ?Recurrent headaches: no ?Visual changes: no ?Palpitations: no ?Dyspnea: no ?Chest pain: no ?Lower extremity edema: no ?Dizzy/lightheaded: no ? ?OSTEOPENIA ?Noted on DEXA 02/27/2019. ?Adequate calcium & vitamin D: no ?Weight bearing exercises: yes  ? ?Depression Screen done today and results listed below:  ? ?  09/17/2021  ?  1:32 PM 02/05/2021  ?  1:19 PM  ?Depression screen PHQ 2/9  ?Decreased Interest 0 0  ?Down, Depressed, Hopeless 0 0  ?PHQ - 2 Score 0 0  ?Altered sleeping 0   ?Tired, decreased energy 0   ?Change in appetite 0   ?Feeling bad or failure about yourself  0   ?Trouble concentrating 0   ?Moving slowly or fidgety/restless 0   ?Suicidal thoughts 0   ?PHQ-9 Score 0   ? ? ?The patient does not have a history of falls. I did not complete a risk assessment for falls. A plan of care for falls was not documented. ? ? ?Past Medical History:  ?Past Medical History:  ?Diagnosis Date  ? Anemia   ? Asthma   ? Hypertension   ? Hypothyroidism   ? Stress incontinence   ? Thyroid disease   ? ? ?Surgical History:  ?Past Surgical History:  ?Procedure Laterality Date  ? CARDIAC  CATHETERIZATION    ? CHONDROPLASTY Right 07/31/2019  ? Procedure: CHONDROPLASTY;  Surgeon: Donato Heinz, MD;  Location: [facility] ORS;  Service: Orthopedics;  Laterality: Right;  ? KNEE ARTHROSCOPY WITH MEDIAL MENISECTOMY Right 07/31/2019  ? Procedure: KNEE ARTHROSCOPY WITH MEDIAL MENISECTOMY;  Surgeon: Donato Heinz, MD;  Location: [facility] ORS;  Service: Orthopedics;  Laterality: Right;  ? ? ?Medications:  ?Current Outpatient Medications on File Prior to Visit  ?Medication Sig  ? albuterol (VENTOLIN HFA) 108 (90 Base) MCG/ACT inhaler Inhale 2 puffs into the lungs  every 6 (six) hours as needed for wheezing or shortness of breath.  ? cholecalciferol (VITAMIN D3) 25 MCG (1000 UNIT) tablet Take 1,000 Units by mouth daily.  ? EUTHYROX 112 MCG tablet Take 112 mcg by mouth every morning.  ? ibuprofen (ADVIL) 200 MG tablet Take 400 mg by mouth every 6 (six) hours as needed for moderate pain.  ? losartan (COZAAR) 50 MG tablet Take 50 mg by mouth daily.  ? vitamin B-12 (CYANOCOBALAMIN) 500 MCG tablet Take 500 mcg by mouth daily.  ? ?No current facility-administered medications on file prior to visit.  ? ? ?Allergies:  ?Allergies  ?Allergen Reactions  ? Prednisone Other (See Comments)  ?  Mood irritability with this -- would like to discuss before this is prescribed.  ? Tramadol Nausea Only  ? ? ?Social History:  ?Social History  ? ?Socioeconomic History  ? Marital status: Married  ?  Spouse name: Not on file  ? Number of children: 3  ? Years of education: Not on file  ? Highest education level: Not on file  ?Occupational History  ? Not on file  ?Tobacco Use  ? Smoking status: Former  ?  Types: Cigarettes  ?  Quit date: 07/27/2009  ?  Years since quitting: 12.1  ? Smokeless tobacco: Never  ?Vaping Use  ? Vaping Use: Former  ? Quit date: 07/27/2014  ?Substance and Sexual Activity  ? Alcohol use: No  ? Drug use: Never  ? Sexual activity: Yes  ?Other Topics Concern  ? Not on file  ?Social History Narrative  ? Not on file  ? ?Social Determinants of Health  ? ?Financial Resource Strain: Low Risk   ? Difficulty of Paying Living Expenses: Not hard at all  ?Food Insecurity: No Food Insecurity  ? Worried About Programme researcher, broadcasting/film/video in the Last Year: Never true  ? Ran Out of Food in the Last Year: Never true  ?Transportation Needs: No Transportation Needs  ? Lack of Transportation (Medical): No  ? Lack of Transportation (Non-Medical): No  ?Physical Activity: Sufficiently Active  ? Days of Exercise per Week: 7 days  ? Minutes of Exercise per Session: 30 min  ?Stress: No Stress Concern Present  ?  Feeling of Stress : Only a little  ?Social Connections: Socially Integrated  ? Frequency of Communication with Friends and Family: More than three times a week  ? Frequency of Social Gatherings with Friends and Family: More than three times a week  ? Attends Religious Services: More than 4 times per year  ? Active Member of Clubs or Organizations: Yes  ? Attends Banker Meetings: Never  ? Marital Status: Married  ?Intimate Partner Violence: Not At Risk  ? Fear of Current or Ex-Partner: No  ? Emotionally Abused: No  ? Physically Abused: No  ? Sexually Abused: No  ? ?Social History  ? ?Tobacco Use  ?Smoking Status Former  ? Types: Cigarettes  ?  Quit date: 07/27/2009  ? Years since quitting: 12.1  ?Smokeless Tobacco Never  ? ?Social History  ? ?Substance and Sexual Activity  ?Alcohol Use No  ? ? ?Family History:  ?Family History  ?Problem Relation Age of Onset  ? Diabetes Mother   ? Hypertension Mother   ? Heart disease Mother   ? Bone cancer Mother   ? Hyperthyroidism Sister   ? Heart attack Sister   ? Breast cancer Neg Hx   ? ? ?Past medical history, surgical history, medications, allergies, family history and social history reviewed with patient today and changes made to appropriate areas of the chart.  ? ?Review of Systems  ?Constitutional:  Negative for chills, fever, malaise/fatigue and weight loss.  ?HENT: Negative.    ?Eyes:  Negative for photophobia.  ?Respiratory:  Negative for cough, shortness of breath and wheezing.   ?Cardiovascular:  Negative for chest pain, palpitations and leg swelling.  ?Gastrointestinal:  Positive for constipation. Negative for nausea and vomiting.  ?     Occasional constipation/ diarrhea  ?Genitourinary:  Negative for dysuria and urgency.  ?Musculoskeletal:  Negative for falls and myalgias.  ?Neurological:  Negative for dizziness, tingling and headaches.  ?Psychiatric/Behavioral:  Negative for depression and suicidal ideas. The patient is not nervous/anxious.   ? ?All  other ROS negative except what is listed above and in the HPI.  ? ?   ?Objective:  ?  ?BP 125/77   Pulse 70   Temp 97.6 ?F (36.4 ?C) (Oral)   Ht 5\' 2"  (1.575 m)   Wt 165 lb 12.8 oz (75.2 kg)   LMP 12/21/2017 Comment: irregular

## 2021-09-17 NOTE — Assessment & Plan Note (Signed)
Chronic, stable.  BP today 125/77, at goal in office and on home readings.  Recommend she monitor BP at least a few mornings a week at home and document.  DASH diet at home.  Continue current medication regimen and adjust as needed. Ordered CMP and Lipid panel. Return in 6 months. ? ?

## 2021-09-17 NOTE — Assessment & Plan Note (Signed)
On DEXA 02/27/2019, Recommend Calcium,  Vit D supplementation and weight bearing exercises. Vitamin D level checked today.  ?

## 2021-09-17 NOTE — Assessment & Plan Note (Signed)
Chronic, stable.  Labs monitored by Dr. Smith Robert with endo at [facility]. Schedule to see Endo in June 2023. Continue current medication regimen as prescribed by them.  Recent notes reviewed and labs. ?

## 2021-09-18 LAB — CBC WITH DIFFERENTIAL/PLATELET
Basophils Absolute: 0.1 10*3/uL (ref 0.0–0.2)
Basos: 2 %
EOS (ABSOLUTE): 0.1 10*3/uL (ref 0.0–0.4)
Eos: 2 %
Hematocrit: 42.7 % (ref 34.0–46.6)
Hemoglobin: 13.8 g/dL (ref 11.1–15.9)
Immature Grans (Abs): 0 10*3/uL (ref 0.0–0.1)
Immature Granulocytes: 0 %
Lymphocytes Absolute: 2.2 10*3/uL (ref 0.7–3.1)
Lymphs: 30 %
MCH: 28.3 pg (ref 26.6–33.0)
MCHC: 32.3 g/dL (ref 31.5–35.7)
MCV: 88 fL (ref 79–97)
Monocytes Absolute: 0.5 10*3/uL (ref 0.1–0.9)
Monocytes: 7 %
Neutrophils Absolute: 4.4 10*3/uL (ref 1.4–7.0)
Neutrophils: 59 %
Platelets: 340 10*3/uL (ref 150–450)
RBC: 4.87 x10E6/uL (ref 3.77–5.28)
RDW: 12.3 % (ref 11.7–15.4)
WBC: 7.3 10*3/uL (ref 3.4–10.8)

## 2021-09-18 LAB — LIPID PANEL W/O CHOL/HDL RATIO
Cholesterol, Total: 165 mg/dL (ref 100–199)
HDL: 52 mg/dL (ref >39)
LDL Chol Calc (NIH): 94 mg/dL (ref 0–99)
Triglycerides: 102 mg/dL (ref 0–149)
VLDL Cholesterol Cal: 19 mg/dL (ref 5–40)

## 2021-09-18 LAB — COMPREHENSIVE METABOLIC PANEL
ALT: 25 IU/L (ref 0–32)
AST: 14 IU/L (ref 0–40)
Albumin/Globulin Ratio: 1.7 (ref 1.2–2.2)
Albumin: 4.7 g/dL (ref 3.8–4.9)
Alkaline Phosphatase: 101 IU/L (ref 44–121)
BUN/Creatinine Ratio: 23 (ref 9–23)
BUN: 17 mg/dL (ref 6–24)
Bilirubin Total: 0.3 mg/dL (ref 0.0–1.2)
CO2: 24 mmol/L (ref 20–29)
Calcium: 9.9 mg/dL (ref 8.7–10.2)
Chloride: 102 mmol/L (ref 96–106)
Creatinine, Ser: 0.73 mg/dL (ref 0.57–1.00)
Globulin, Total: 2.7 g/dL (ref 1.5–4.5)
Glucose: 83 mg/dL (ref 70–99)
Potassium: 4.1 mmol/L (ref 3.5–5.2)
Sodium: 141 mmol/L (ref 134–144)
Total Protein: 7.4 g/dL (ref 6.0–8.5)
eGFR: 98 mL/min/{1.73_m2} (ref >59)

## 2021-09-18 LAB — VITAMIN B12: Vitamin B-12: 457 pg/mL (ref 232–1245)

## 2021-09-18 LAB — IRON AND TIBC
Iron Saturation: 17 % (ref 15–55)
Iron: 53 ug/dL (ref 27–159)
Total Iron Binding Capacity: 318 ug/dL (ref 250–450)
UIBC: 265 ug/dL (ref 131–425)

## 2021-09-18 LAB — FERRITIN: Ferritin: 73 ng/mL (ref 15–150)

## 2021-09-18 LAB — VITAMIN D 25 HYDROXY (VIT D DEFICIENCY, FRACTURES): Vit D, 25-Hydroxy: 43.9 ng/mL (ref 30.0–100.0)

## 2021-09-18 NOTE — Progress Notes (Signed)
Contacted via [place] [facility] ?The 10-year ASCVD risk score (Arnett DK, et al., 2019) is: 2% ?  Values used to calculate the score: ?    Age: 56 years ?    Sex: Female ?    Is Non-Hispanic African American: No ?    Diabetic: No ?    Tobacco smoker: No ?    Systolic Blood Pressure: 664 mmHg ?    Is BP treated: Yes ?    HDL Cholesterol: 52 mg/dL ?    Total Cholesterol: 165 mg/dL ? ? ?Good morning Gabrielle Mendoza, only missing Ferritin so far on labs, but overall your labs look fantastic with no concerns at this time.  Kidney function, creatinine and eGFR, remains normal, as is liver function, AST and ALT.  Iron level stable.  Cholesterol levels stable.  Will complete and fax your form today.  Any questions? ?Keep being amazing!!  Thank you for allowing me to participate in your care.  I appreciate you. ?Kindest regards, ?Gabrielle Mendoza ?

## 2021-09-22 ENCOUNTER — Encounter: Payer: Self-pay | Admitting: Nurse Practitioner

## 2021-11-20 ENCOUNTER — Encounter: Payer: Self-pay | Admitting: Nurse Practitioner

## 2021-11-20 NOTE — Patient Instructions (Signed)
Urinary Tract Infection, Adult A urinary tract infection (UTI) is an infection of any part of the urinary tract. The urinary tract includes: The kidneys. The ureters. The bladder. The urethra. These organs make, store, and get rid of pee (urine) in the body. What are the causes? This infection is caused by germs (bacteria) in your genital area. These germs grow and cause swelling (inflammation) of your urinary tract. What increases the risk? The following factors may make you more likely to develop this condition: Using a small, thin tube (catheter) to drain pee. Not being able to control when you pee or poop (incontinence). Being female. If you are female, these things can increase the risk: Using these methods to prevent pregnancy: A medicine that kills sperm (spermicide). A device that blocks sperm (diaphragm). Having low levels of a female hormone (estrogen). Being pregnant. You are more likely to develop this condition if: You have genes that add to your risk. You are sexually active. You take antibiotic medicines. You have trouble peeing because of: A prostate that is bigger than normal, if you are female. A blockage in the part of your body that drains pee from the bladder. A kidney stone. A nerve condition that affects your bladder. Not getting enough to drink. Not peeing often enough. You have other conditions, such as: Diabetes. A weak disease-fighting system (immune system). Sickle cell disease. Gout. Injury of the spine. What are the signs or symptoms? Symptoms of this condition include: Needing to pee right away. Peeing small amounts often. Pain or burning when peeing. Blood in the pee. Pee that smells bad or not like normal. Trouble peeing. Pee that is cloudy. Fluid coming from the vagina, if you are female. Pain in the belly or lower back. Other symptoms include: Vomiting. Not feeling hungry. Feeling mixed up (confused). This may be the first symptom in  older adults. Being tired and grouchy (irritable). A fever. Watery poop (diarrhea). How is this treated? Taking antibiotic medicine. Taking other medicines. Drinking enough water. In some cases, you may need to see a specialist. Follow these instructions at home:  Medicines Take over-the-counter and prescription medicines only as told by your doctor. If you were prescribed an antibiotic medicine, take it as told by your doctor. Do not stop taking it even if you start to feel better. General instructions Make sure you: Pee until your bladder is empty. Do not hold pee for a long time. Empty your bladder after sex. Wipe from front to back after peeing or pooping if you are a female. Use each tissue one time when you wipe. Drink enough fluid to keep your pee pale yellow. Keep all follow-up visits. Contact a doctor if: You do not get better after 1-2 days. Your symptoms go away and then come back. Get help right away if: You have very bad back pain. You have very bad pain in your lower belly. You have a fever. You have chills. You feeling like you will vomit or you vomit. Summary A urinary tract infection (UTI) is an infection of any part of the urinary tract. This condition is caused by germs in your genital area. There are many risk factors for a UTI. Treatment includes antibiotic medicines. Drink enough fluid to keep your pee pale yellow. This information is not intended to replace advice given to you by your health care provider. Make sure you discuss any questions you have with your health care provider. Document Revised: 01/26/2020 Document Reviewed: 01/26/2020 Elsevier Patient Education    2023 Elsevier Inc.  

## 2021-11-21 ENCOUNTER — Ambulatory Visit: Payer: Managed Care, Other (non HMO) | Admitting: Nurse Practitioner

## 2021-11-21 ENCOUNTER — Encounter: Payer: Self-pay | Admitting: Nurse Practitioner

## 2021-11-21 VITALS — BP 133/75 | HR 59 | Temp 97.6°F | Ht 62.0 in | Wt 165.2 lb

## 2021-11-21 DIAGNOSIS — R399 Unspecified symptoms and signs involving the genitourinary system: Secondary | ICD-10-CM

## 2021-11-21 DIAGNOSIS — R8281 Pyuria: Secondary | ICD-10-CM

## 2021-11-21 LAB — URINALYSIS, ROUTINE W REFLEX MICROSCOPIC
Bilirubin, UA: NEGATIVE
Glucose, UA: NEGATIVE
Ketones, UA: NEGATIVE
Nitrite, UA: NEGATIVE
Protein,UA: NEGATIVE
Specific Gravity, UA: 1.02 (ref 1.005–1.030)
Urobilinogen, Ur: 0.2 mg/dL (ref 0.2–1.0)
pH, UA: 7 (ref 5.0–7.5)

## 2021-11-21 LAB — MICROSCOPIC EXAMINATION: Epithelial Cells (non renal): NONE SEEN /hpf (ref 0–10)

## 2021-11-21 LAB — WET PREP FOR TRICH, YEAST, CLUE
Clue Cell Exam: NEGATIVE
Trichomonas Exam: NEGATIVE
Yeast Exam: NEGATIVE

## 2021-11-21 NOTE — Progress Notes (Signed)
BP 133/75   Pulse (!) 59   Temp 97.6 F (36.4 C) (Oral)   Ht 5\' 2"  (1.575 m)   Wt 165 lb 3.2 oz (74.9 kg)   LMP 12/21/2017 Comment: irregular periods  SpO2 97%   BMI 30.22 kg/m    Subjective:    Patient ID: 12/23/2017, female    DOB: Oct 04, 1965, 56 y.o.   MRN: [id number]  HPI: Gabrielle Mendoza is a 56 y.o. female  Chief Complaint  Patient presents with   Urinary Tract Infection    Patient is here for possible UTI. Patient states her symptoms started last Monday or Tuesday. She has burning with urination and she had pills left over from a prior prescription for BV and she took the medication, but the burning has went away. Patient states it cleared up the infection for a few days and it wasn't enough prescription. Patient states she now having lower back pain that woke her up at 5 am this morning. Patient states she is having tingling sensation when she first starts to urinate.    NOTE WRITTEN BY FNP STUDENT.  ASSESSMENT AND PLAN OF CARE REVIEWED WITH STUDENT, AGREE WITH ABOVE FINDINGS AND PLAN.   URINARY SYMPTOMS Dysuria: burning Urinary frequency: yes Urgency: yes Small volume voids: yes Symptom severity: no Urinary incontinence:  stress incontinence at baseline Foul odor: no Hematuria: no Abdominal pain: no Back pain: yes Suprapubic pain/pressure:  mild pressure Flank pain: no Fever:  no Vomiting: no Relief with cranberry juice: no Relief with pyridium:  yes Status: slight improvement Previous urinary tract infection: yes Recurrent urinary tract infection: no Sexual activity: Not sexually active History of sexually transmitted disease: no Treatments attempted: antibiotics and pyridium , took a few metronidazole that was previously prescribed.  Relevant past medical, surgical, family and social history reviewed and updated as indicated. Interim medical history since our last visit reviewed. Allergies and medications reviewed and updated.  Review of  Systems  Constitutional: Negative.   HENT: Negative.    Eyes: Negative.   Respiratory: Negative.    Cardiovascular: Negative.   Gastrointestinal: Negative.   Endocrine: Negative.   Genitourinary:  Positive for dysuria, frequency and urgency.  Musculoskeletal:  Positive for back pain.  Skin: Negative.   Allergic/Immunologic: Negative.   Neurological: Negative.   Hematological: Negative.   Psychiatric/Behavioral: Negative.     Per HPI unless specifically indicated above     Objective:    BP 133/75   Pulse (!) 59   Temp 97.6 F (36.4 C) (Oral)   Ht 5\' 2"  (1.575 m)   Wt 165 lb 3.2 oz (74.9 kg)   LMP 12/21/2017 Comment: irregular periods  SpO2 97%   BMI 30.22 kg/m   Wt Readings from Last 3 Encounters:  11/21/21 165 lb 3.2 oz (74.9 kg)  09/17/21 165 lb 12.8 oz (75.2 kg)  07/22/21 167 lb (75.8 kg)    Physical Exam Constitutional:      Appearance: Normal appearance. She is normal weight. She is not ill-appearing.  Cardiovascular:     Rate and Rhythm: Normal rate and regular rhythm.     Heart sounds: Normal heart sounds.  Pulmonary:     Effort: Pulmonary effort is normal.     Breath sounds: Normal breath sounds. No wheezing.  Abdominal:     General: Bowel sounds are normal.     Palpations: Abdomen is soft.     Tenderness: There is no abdominal tenderness. There is no right CVA tenderness or  left CVA tenderness.  Musculoskeletal:     Cervical back: Normal range of motion.  Neurological:     General: No focal deficit present.     Mental Status: She is alert.  Psychiatric:        Attention and Perception: Attention normal.        Mood and Affect: Mood normal.        Speech: Speech normal.        Behavior: Behavior is cooperative.    Results for orders placed or performed in visit on 11/21/21  Microscopic Examination  Result Value Ref Range   WBC, UA 0-5 0 - 5 /hpf   RBC 0-2 0 - 2 /hpf   Epithelial Cells (non renal) None seen 0 - 10 /hpf   Bacteria, UA Few (A)  None seen/Few  WET PREP FOR TRICH, YEAST, CLUE   Sterile Swab  Result Value Ref Range   Trichomonas Exam Negative Negative   Yeast Exam Negative Negative   Clue Cell Exam Negative Negative  Urinalysis, Routine w reflex microscopic  Result Value Ref Range   Specific Gravity, UA 1.020 1.005 - 1.030   pH, UA 7.0 5.0 - 7.5   Color, UA Yellow Yellow   Appearance Ur Clear Clear   Leukocytes,UA Trace (A) Negative   Protein,UA Negative Negative/Trace   Glucose, UA Negative Negative   Ketones, UA Negative Negative   RBC, UA Trace (A) Negative   Bilirubin, UA Negative Negative   Urobilinogen, Ur 0.2 0.2 - 1.0 mg/dL   Nitrite, UA Negative Negative   Microscopic Examination See below:       Assessment & Plan:   Problem List Items Addressed This Visit       Other   Urinary symptom or sign - Primary    Acute for 5 days. UA shows few bacteria. Wet prep neg for trich, yeast and clue cells. Will send urine for culture. Recommend increased fluids. Possible treatment pending culture results. Educated patient to return if symptoms worsen or fail to resolve.        Relevant Orders   WET PREP FOR TRICH, YEAST, CLUE   Urinalysis, Routine w reflex microscopic (Completed)   Urine Culture   Other Visit Diagnoses     Pyuria       Urine for culture   Relevant Orders   Urine Culture        Follow up plan: Return if symptoms worsen or fail to improve.

## 2021-11-21 NOTE — Progress Notes (Deleted)
BP 133/75   Pulse (!) 59   Temp 97.6 F (36.4 C) (Oral)   Ht $R'5\' 2"'Gc$  (1.575 m)   Wt 165 lb 3.2 oz (74.9 kg)   LMP 12/21/2017 Comment: irregular periods  SpO2 97%   BMI 30.22 kg/m    Subjective:    Patient ID: Gabrielle Mendoza, female    DOB: Jan 11, 1966, 56 y.o.   MRN: [id number]  HPI: Gabrielle Mendoza is a 56 y.o. female  Chief Complaint  Patient presents with   Urinary Tract Infection    Patient is here for possible UTI. Patient states her symptoms started last Monday or Tuesday. She has burning with urination and she had pills left over from a prior prescription for BV and she took the medication, but the burning has went away. Patient states it cleared up the infection for a few days and it wasn't enough prescription. Patient states she now having lower back pain that woke her up at 5 am this morning. Patient states she is having tingling sensation when she first starts to urinate.    URINARY SYMPTOMS  Dysuria: {Blank single:19197::"yes","no","burning"} Urinary frequency: {Blank single:19197::"yes","no"} Urgency: {Blank single:19197::"yes","no"} Small volume voids: {Blank single:19197::"yes","no"} Symptom severity: {Blank single:19197::"yes","no"} Urinary incontinence: {Blank single:19197::"yes","no"} Foul odor: {Blank single:19197::"yes","no"} Hematuria: {Blank single:19197::"yes","no"} Abdominal pain: {Blank single:19197::"yes","no"} Back pain: {Blank single:19197::"yes","no"} Suprapubic pain/pressure: {Blank single:19197::"yes","no"} Flank pain: {Blank single:19197::"yes","no"} Fever:  {Blank multiple:19196::"yes","no","subjective","low grade"} Vomiting: {Blank single:19197::"yes","no"} Relief with cranberry juice: {Blank single:19197::"yes","no"} Relief with pyridium: {Blank single:19197::"yes","no"} Status: better/worse/stable Previous urinary tract infection: {Blank single:19197::"yes","no"} Recurrent urinary tract infection: {Blank  single:19197::"yes","no"} Sexual activity: No sexually active/monogomous/practicing safe sex History of sexually transmitted disease: {Blank single:19197::"yes","no"} Penile discharge: {Blank single:19197::"yes","no"} Treatments attempted: {Blank multiple:19196::"none","antibiotics","pyridium","cranberry","increasing fluids"}    Relevant past medical, surgical, family and social history reviewed and updated as indicated. Interim medical history since our last visit reviewed. Allergies and medications reviewed and updated.  Review of Systems  Per HPI unless specifically indicated above     Objective:    BP 133/75   Pulse (!) 59   Temp 97.6 F (36.4 C) (Oral)   Ht $R'5\' 2"'uT$  (1.575 m)   Wt 165 lb 3.2 oz (74.9 kg)   LMP 12/21/2017 Comment: irregular periods  SpO2 97%   BMI 30.22 kg/m   Wt Readings from Last 3 Encounters:  11/21/21 165 lb 3.2 oz (74.9 kg)  09/17/21 165 lb 12.8 oz (75.2 kg)  07/22/21 167 lb (75.8 kg)    Physical Exam  Results for orders placed or performed in visit on 09/17/21  CBC with Differential/Platelet  Result Value Ref Range   WBC 7.3 3.4 - 10.8 x10E3/uL   RBC 4.87 3.77 - 5.28 x10E6/uL   Hemoglobin 13.8 11.1 - 15.9 g/dL   Hematocrit 42.7 34.0 - 46.6 %   MCV 88 79 - 97 fL   MCH 28.3 26.6 - 33.0 pg   MCHC 32.3 31.5 - 35.7 g/dL   RDW 12.3 11.7 - 15.4 %   Platelets 340 150 - 450 x10E3/uL   Neutrophils 59 Not Estab. %   Lymphs 30 Not Estab. %   Monocytes 7 Not Estab. %   Eos 2 Not Estab. %   Basos 2 Not Estab. %   Neutrophils Absolute 4.4 1.4 - 7.0 x10E3/uL   Lymphocytes Absolute 2.2 0.7 - 3.1 x10E3/uL   Monocytes Absolute 0.5 0.1 - 0.9 x10E3/uL   EOS (ABSOLUTE) 0.1 0.0 - 0.4 x10E3/uL   Basophils Absolute 0.1 0.0 - 0.2 x10E3/uL   Immature Granulocytes  0 Not Estab. %   Immature Grans (Abs) 0.0 0.0 - 0.1 x10E3/uL  Comprehensive metabolic panel  Result Value Ref Range   Glucose 83 70 - 99 mg/dL   BUN 17 6 - 24 mg/dL   Creatinine, Ser 0.73 0.57 - 1.00  mg/dL   eGFR 98 >59 mL/min/1.73   BUN/Creatinine Ratio 23 9 - 23   Sodium 141 134 - 144 mmol/L   Potassium 4.1 3.5 - 5.2 mmol/L   Chloride 102 96 - 106 mmol/L   CO2 24 20 - 29 mmol/L   Calcium 9.9 8.7 - 10.2 mg/dL   Total Protein 7.4 6.0 - 8.5 g/dL   Albumin 4.7 3.8 - 4.9 g/dL   Globulin, Total 2.7 1.5 - 4.5 g/dL   Albumin/Globulin Ratio 1.7 1.2 - 2.2   Bilirubin Total 0.3 0.0 - 1.2 mg/dL   Alkaline Phosphatase 101 44 - 121 IU/L   AST 14 0 - 40 IU/L   ALT 25 0 - 32 IU/L  Lipid Panel w/o Chol/HDL Ratio  Result Value Ref Range   Cholesterol, Total 165 100 - 199 mg/dL   Triglycerides 102 0 - 149 mg/dL   HDL 52 >39 mg/dL   VLDL Cholesterol Cal 19 5 - 40 mg/dL   LDL Chol Calc (NIH) 94 0 - 99 mg/dL  Vitamin B12  Result Value Ref Range   Vitamin B-12 457 232 - 1,245 pg/mL  VITAMIN D 25 Hydroxy (Vit-D Deficiency, Fractures)  Result Value Ref Range   Vit D, 25-Hydroxy 43.9 30.0 - 100.0 ng/mL  Iron Binding Cap (TIBC)([facility])  Result Value Ref Range   Total Iron Binding Capacity 318 250 - 450 ug/dL   UIBC 265 131 - 425 ug/dL   Iron 53 27 - 159 ug/dL   Iron Saturation 17 15 - 55 %  Ferritin  Result Value Ref Range   Ferritin 73 15 - 150 ng/mL      Assessment & Plan:   Problem List Items Addressed This Visit   None Visit Diagnoses     Urinary symptom or sign    -  Primary   Relevant Orders   WET PREP FOR TRICH, YEAST, CLUE   Urinalysis, Routine w reflex microscopic        Follow up plan: No follow-ups on file.

## 2021-11-21 NOTE — Assessment & Plan Note (Signed)
Acute for 5 days. UA shows few bacteria. Wet prep neg for trich, yeast and clue cells. Will send urine for culture. Recommend increased fluids. Possible treatment pending culture results. Educated patient to return if symptoms worsen or fail to resolve.

## 2021-11-25 LAB — URINE CULTURE

## 2021-11-25 NOTE — Progress Notes (Signed)
Contacted via MyChart   Good evening Gabrielle Mendoza, urine returned showing some normal flora and then  <100,000 of bacteria -- your level is 10,000 to 25,000 I would not treat this.  How are you feeling now?  Are symptoms improved?  If worsening let me know and I may send in treatment as this may be early culture, it could also be we tested at end of infection and it was clearing.  Let me know.  Any questions? Keep being amazing!!  Thank you for allowing me to participate in your care.  I appreciate you. Kindest regards, Walton Digilio

## 2021-11-28 ENCOUNTER — Encounter: Payer: Self-pay | Admitting: Nurse Practitioner

## 2021-12-09 IMAGING — MR MR KNEE*R* W/O CM
6 series · 40 of 40 positions shown · non-contrast
Comparison: None.

CLINICAL DATA: Persistent right knee pain since last [REDACTED]. No
known injury.

EXAM:
MRI OF THE RIGHT KNEE WITHOUT CONTRAST
TECHNIQUE: Multiplanar, multisequence MR imaging of the knee was performed. No
intravenous contrast was administered.

[Series 8: T2 fat-sat · axial · right · 4.0mm · 0.50mm/px · z∈[-25,+99]mm · 6 of 26 slices shown (1 of 3)]
[im 1/26]
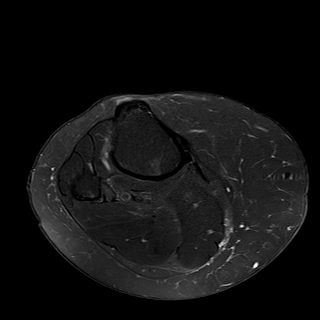
[im 6/26]
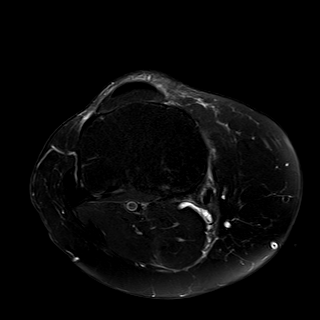
[im 11/26]
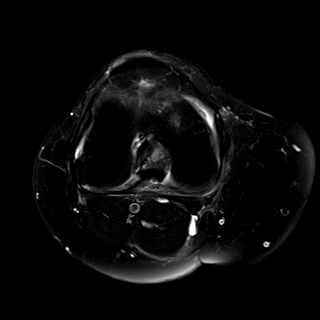
[im 16/26]
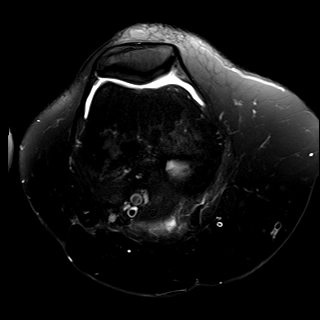
[im 21/26]
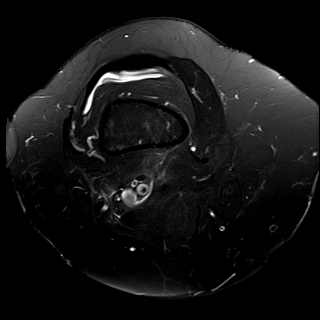
[im 26/26]
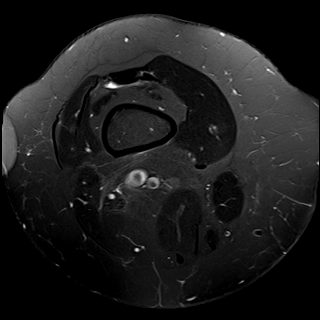

[Series 9: T2 fat-sat · coronal · right · 4.0mm · 0.59mm/px · 6 of 29 slices shown (2 of 3)]
[im 1/29]
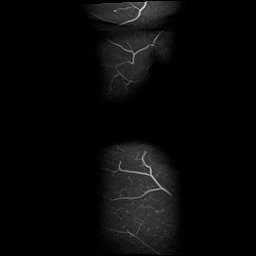
[im 6/29]
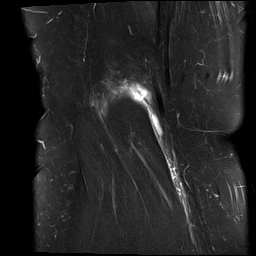
[im 12/29]
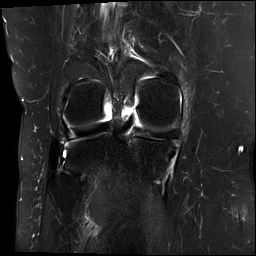
[im 17/29]
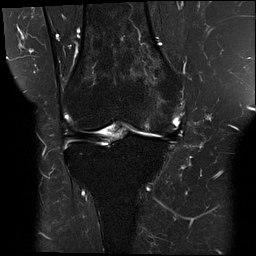
[im 23/29]
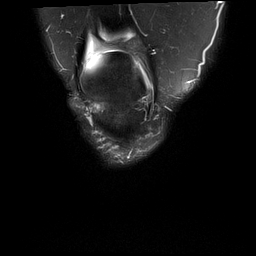
[im 29/29]
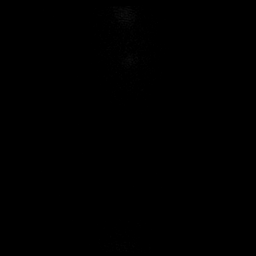

[Series 10: T1 · coronal · right · 4.0mm · 0.59mm/px · 6 of 30 slices shown]
[im 1/30]
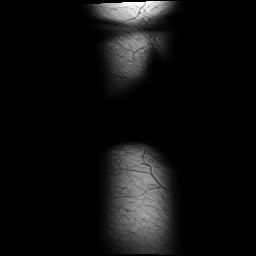
[im 6/30]
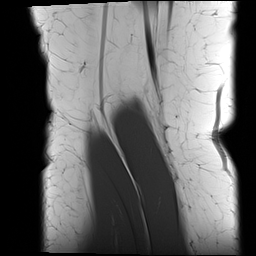
[im 12/30]
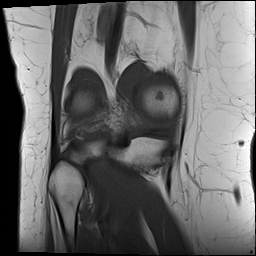
[im 18/30]
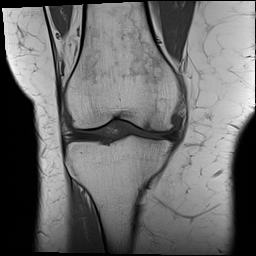
[im 24/30]
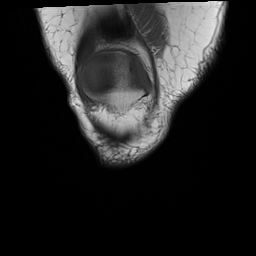
[im 30/30]
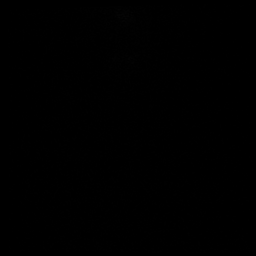

[Series 11: PD fat-sat · coronal · right · 4.0mm · 0.59mm/px · 6 of 29 slices shown (1 of 2)]
[im 1/29]
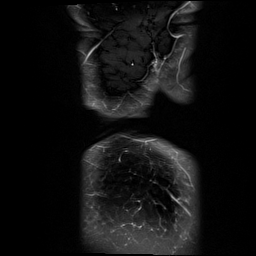
[im 6/29]
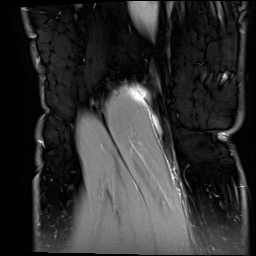
[im 12/29]
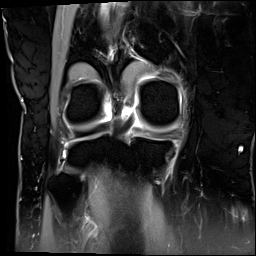
[im 17/29]
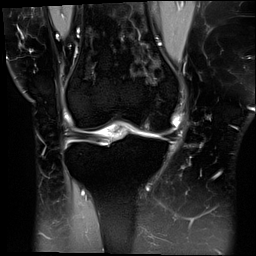
[im 23/29]
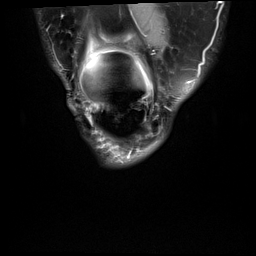
[im 29/29]
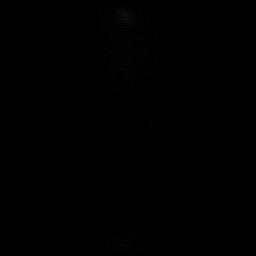

[Series 12: PD fat-sat · sagittal · right · 3.0mm · 0.59mm/px · 8 of 35 slices shown (2 of 2)]
[im 1/35]
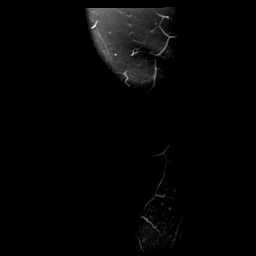
[im 5/35]
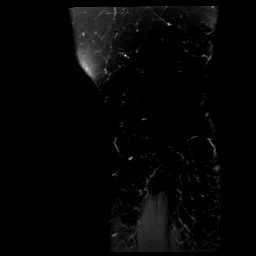
[im 10/35]
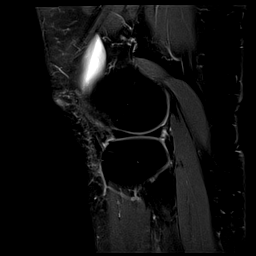
[im 15/35]
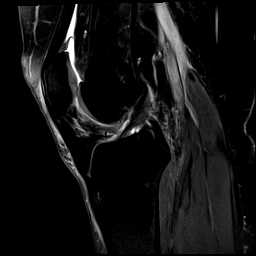
[im 20/35]
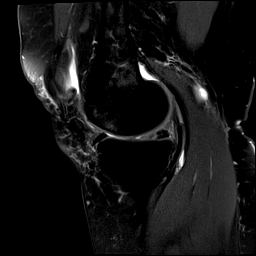
[im 25/35]
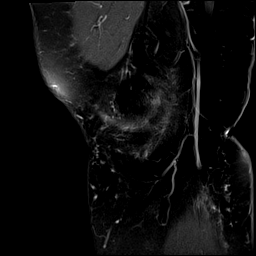
[im 30/35]
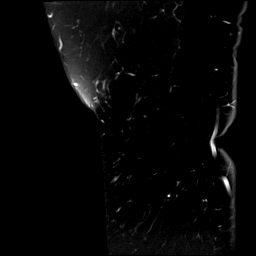
[im 35/35]
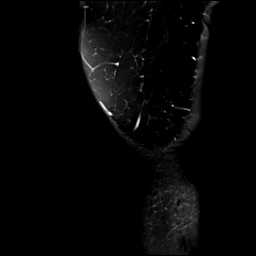

[Series 13: T2 fat-sat · sagittal · right · 3.0mm · 0.59mm/px · 8 of 36 slices shown (3 of 3)]
[im 1/36]
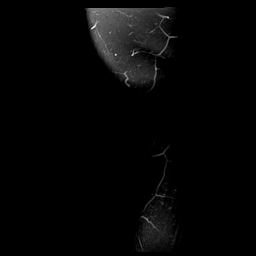
[im 6/36]
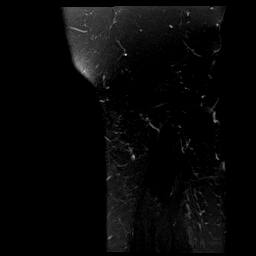
[im 11/36]
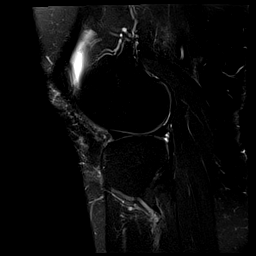
[im 16/36]
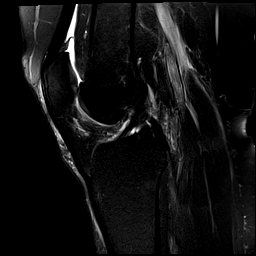
[im 21/36]
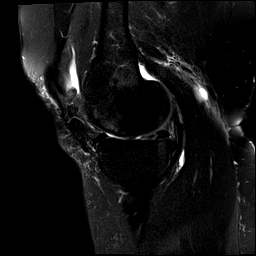
[im 26/36]
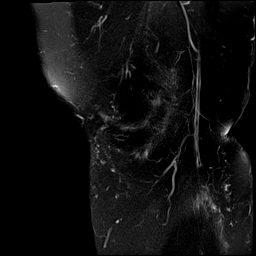
[im 31/36]
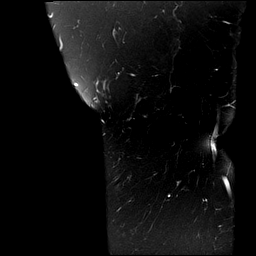
[im 36/36]
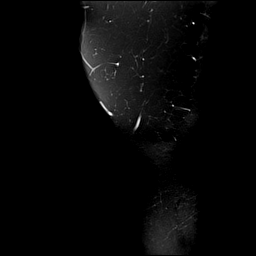

[40 of 40 positions shown; findings below may reference images not displayed]

FINDINGS: MENISCI

Medial meniscus: Horizontal tear of the posterior horn extending
into the body. Longitudinal tear of the posterior root.

Lateral meniscus:  Intact.

LIGAMENTS

Cruciates:  Intact ACL and PCL.

Collaterals: Medial collateral ligament is intact. Lateral
collateral ligament complex is intact.

CARTILAGE

Patellofemoral: Partial-thickness cartilage loss over the medial
trochlea with subchondral marrow edema.

Medial: Diffuse thinning with near full-thickness cartilage loss
over the central weight-bearing medial femoral condyle and medial
tibial plateau.

Lateral:  Diffuse thinning without focal defect.

Joint: Small joint effusion. Mild edema within Hoffa's fat. No
plical thickening.

Popliteal Fossa:  Tiny Baker cyst.  Intact popliteus tendon.

Extensor Mechanism: Intact quadriceps tendon and patellar tendon.
Intact medial and lateral patellar retinaculum. Intact MPFL.

Bones: No acute fracture or dislocation. No suspicious bone lesion.

Other: None.
IMPRESSION: 1. Horizontal tear of the medial meniscus posterior horn extending
into the body. Additional longitudinal tear of the posterior root.
2. Tricompartmental osteoarthritis, mild-to-moderate in the medial
compartment.

## 2022-01-01 ENCOUNTER — Encounter: Payer: Self-pay | Admitting: Nurse Practitioner

## 2022-02-09 ENCOUNTER — Other Ambulatory Visit: Payer: Self-pay

## 2022-02-09 MED ORDER — LOSARTAN POTASSIUM 50 MG PO TABS: 50.0000 mg | ORAL_TABLET | Freq: Every day | ORAL | 4 refills | Status: DC

## 2022-02-09 NOTE — Telephone Encounter (Signed)
Patient is out of medication, has appointment tomorrow.

## 2022-02-10 ENCOUNTER — Telehealth (INDEPENDENT_AMBULATORY_CARE_PROVIDER_SITE_OTHER): Payer: Managed Care, Other (non HMO) | Admitting: Nurse Practitioner

## 2022-02-10 ENCOUNTER — Encounter: Payer: Self-pay | Admitting: Nurse Practitioner

## 2022-02-10 DIAGNOSIS — F418 Other specified anxiety disorders: Secondary | ICD-10-CM | POA: Diagnosis not present

## 2022-02-10 MED ORDER — CITALOPRAM HYDROBROMIDE 10 MG PO TABS: 10.0000 mg | ORAL_TABLET | Freq: Every day | ORAL | 3 refills | Status: DC

## 2022-02-10 NOTE — Progress Notes (Signed)
LVM asking patient to call back to schedule follow up  

## 2022-02-10 NOTE — Progress Notes (Signed)
LMP 12/21/2017 Comment: irregular periods   Subjective:    Patient ID: Gabrielle Mendoza, female    DOB: 12/24/1965, 56 y.o.   MRN: [id number]  HPI: Gabrielle Mendoza is a 56 y.o. female  Chief Complaint  Patient presents with   Anxiety    Patient states she is under a lot of stress. States going through a lot    This visit was completed via video visit through MyChart due to the restrictions of the COVID-19 pandemic. All issues as above were discussed and addressed. Physical exam was done as above through visual confirmation on video through MyChart. If it was felt that the patient should be evaluated in the office, they were directed there. The patient verbally consented to this visit. Location of the patient: work Location of the provider: work Those involved with this call:  Provider: Aura Dials, DNP CMA: Malen Gauze, CMA Front Desk/Registration: [employer]  Time spent on call:  21 minutes with patient face to face via video conference. More than 50% of this time was spent in counseling and coordination of care. 15 minutes total spent in review of patient's record and preparation of their chart.  I verified patient identity using two factors (patient name and date of birth). Patient consents verbally to being seen via telemedicine visit today.    ANXIETY/STRESS Under a lot of stressors -- oldest son with health issues.  Feels like her heart just hurts at times.  Anxiety has been going on for several months.  Everyone comes to her for everything.   Duration:uncontrolled Anxious mood: yes  Excessive worrying: yes Irritability: yes  Sweating: no Nausea: no Palpitations:no Hyperventilation: no Panic attacks: no Agoraphobia: no  Obscessions/compulsions: no Depressed mood: yes    03-Mar-2022    8:50 AM 09/17/2021    1:32 PM 02/05/2021    1:19 PM  Depression screen PHQ 2/9  Decreased Interest 2 0 0  Down, Depressed, Hopeless 0 0 0  PHQ - 2 Score 2 0 0  Altered  sleeping 3 0   Tired, decreased energy 3 0   Change in appetite 3 0   Feeling bad or failure about yourself  0 0   Trouble concentrating 0 0   Moving slowly or fidgety/restless 0 0   Suicidal thoughts 0 0   PHQ-9 Score 11 0   Difficult doing work/chores Not difficult at all    Anhedonia: yes Weight changes: no Insomnia: yes hard to stay asleep  Hypersomnia: no Fatigue/loss of energy: yes Feelings of worthlessness: yes Feelings of guilt: yes Impaired concentration/indecisiveness: no Suicidal ideations: no  Crying spells: yes Recent Stressors/Life Changes: no   Relationship problems: no   Family stress: yes     Financial stress: no    Job stress: no    Recent death/loss: no     03-03-22    8:52 AM 09/17/2021    1:32 PM  GAD 7 : Generalized Anxiety Score  Nervous, Anxious, on Edge 3 0  Control/stop worrying 3 0  Worry too much - different things 3 0  Trouble relaxing 3 0  Restless 3 0  Easily annoyed or irritable 3 0  Afraid - awful might happen 3 0  Total GAD 7 Score 21 0  Anxiety Difficulty Not difficult at all     Relevant past medical, surgical, family and social history reviewed and updated as indicated. Interim medical history since our last visit reviewed. Allergies and medications reviewed and updated.  Review of Systems  Constitutional:  Negative for activity change, appetite change, diaphoresis, fatigue and fever.  Respiratory:  Negative for cough, chest tightness and shortness of breath.   Cardiovascular:  Negative for chest pain, palpitations and leg swelling.  Gastrointestinal: Negative.   Neurological: Negative.   Psychiatric/Behavioral:  Positive for sleep disturbance. Negative for decreased concentration, self-injury and suicidal ideas. The patient is nervous/anxious.     Per HPI unless specifically indicated above     Objective:    LMP 12/21/2017 Comment: irregular periods  Wt Readings from Last 3 Encounters:  11/21/21 165 lb 3.2 oz (74.9 kg)   09/17/21 165 lb 12.8 oz (75.2 kg)  07/22/21 167 lb (75.8 kg)    Physical Exam Vitals and nursing note reviewed.  Constitutional:      General: She is awake. She is not in acute distress.    Appearance: She is well-developed. She is not ill-appearing.  HENT:     Head: Normocephalic.     Right Ear: Hearing normal.     Left Ear: Hearing normal.  Eyes:     General: Lids are normal.        Right eye: No discharge.        Left eye: No discharge.     Conjunctiva/sclera: Conjunctivae normal.  Pulmonary:     Effort: Pulmonary effort is normal. No accessory muscle usage or respiratory distress.  Musculoskeletal:     Cervical back: Normal range of motion.  Neurological:     Mental Status: She is alert and oriented to person, place, and time.  Psychiatric:        Attention and Perception: Attention normal.        Mood and Affect: Mood normal.        Behavior: Behavior normal. Behavior is cooperative.        Thought Content: Thought content normal.        Judgment: Judgment normal.     Results for orders placed or performed in visit on 11/21/21  Urine Culture   Specimen: Urine   UR  Result Value Ref Range   Urine Culture, Routine Final report (A)    Organism ID, Bacteria Escherichia coli (A)    ORGANISM ID, BACTERIA Comment    Antimicrobial Susceptibility Comment   Microscopic Examination  Result Value Ref Range   WBC, UA 0-5 0 - 5 /hpf   RBC, Urine 0-2 0 - 2 /hpf   Epithelial Cells (non renal) None seen 0 - 10 /hpf   Bacteria, UA Few (A) None seen/Few  WET PREP FOR TRICH, YEAST, CLUE   Sterile Swab  Result Value Ref Range   Trichomonas Exam Negative Negative   Yeast Exam Negative Negative   Clue Cell Exam Negative Negative  Urinalysis, Routine w reflex microscopic  Result Value Ref Range   Specific Gravity, UA 1.020 1.005 - 1.030   pH, UA 7.0 5.0 - 7.5   Color, UA Yellow Yellow   Appearance Ur Clear Clear   Leukocytes,UA Trace (A) Negative   Protein,UA Negative  Negative/Trace   Glucose, UA Negative Negative   Ketones, UA Negative Negative   RBC, UA Trace (A) Negative   Bilirubin, UA Negative Negative   Urobilinogen, Ur 0.2 0.2 - 1.0 mg/dL   Nitrite, UA Negative Negative   Microscopic Examination See below:       Assessment & Plan:   Problem List Items Addressed This Visit       Other   Situational anxiety    New onset over past  months with family stressors present. Denies SI/HI.  PHQ9 = 11 and GAD7 = 21.  Discussed at length various treatment options with her including no treatment, therapy only, medication only, and therapy + medication.  Educated on SSRI, SNRI, and Buspar.  At this time: - Start Celexa 10 MG daily, educated her on this medication and side effects + use.  Discussed BLACK BOX warning.  To alert provider if SI presents. - Recommend she obtain therapy via her workplace program, this would be beneficial for CBT approach.   - Return in 6 weeks, sooner if worsening mood presents.        Relevant Medications   citalopram (CELEXA) 10 MG tablet    I discussed the assessment and treatment plan with the patient. The patient was provided an opportunity to ask questions and all were answered. The patient agreed with the plan and demonstrated an understanding of the instructions.   The patient was advised to call back or seek an in-person evaluation if the symptoms worsen or if the condition fails to improve as anticipated.   I provided 21+ minutes of time during this encounter.   Follow up plan: Return in about 6 weeks (around 03/24/2022) for Anxiety.

## 2022-02-10 NOTE — Patient Instructions (Signed)
Depression and Anxiety You will learn about mood disorders, how these conditions can occur in people with heart disease, and why it is important to treat these conditions. To view the content, go to this web address: [email or web address].[email or web address]  This video will expire on: 12/20/2023. If you need access to this video following this date, please reach out to the healthcare provider who assigned it to you. This information is not intended to replace advice given to you by your health care provider. Make sure you discuss any questions you have with your health care provider. Elsevier Patient Education  2023 [employer] Inc.  

## 2022-02-10 NOTE — Assessment & Plan Note (Signed)
New onset over past months with family stressors present. Denies SI/HI.  PHQ9 = 11 and GAD7 = 21.  Discussed at length various treatment options with her including no treatment, therapy only, medication only, and therapy + medication.  Educated on SSRI, SNRI, and Buspar.  At this time: - Start Celexa 10 MG daily, educated her on this medication and side effects + use.  Discussed BLACK BOX warning.  To alert provider if SI presents. - Recommend she obtain therapy via her workplace program, this would be beneficial for CBT approach.   - Return in 6 weeks, sooner if worsening mood presents.

## 2022-02-13 ENCOUNTER — Ambulatory Visit: Payer: Managed Care, Other (non HMO) | Admitting: Nurse Practitioner

## 2022-02-17 ENCOUNTER — Encounter: Payer: Self-pay | Admitting: Nurse Practitioner

## 2022-02-18 NOTE — Telephone Encounter (Signed)
Pt scheduled 8/23

## 2022-02-18 NOTE — Telephone Encounter (Signed)
Sorry, pt scheduled 8/28 :)

## 2022-03-26 ENCOUNTER — Ambulatory Visit: Payer: Managed Care, Other (non HMO) | Admitting: Nurse Practitioner

## 2022-03-26 ENCOUNTER — Encounter: Payer: Self-pay | Admitting: Nurse Practitioner

## 2022-03-27 ENCOUNTER — Ambulatory Visit: Payer: Managed Care, Other (non HMO) | Admitting: Nurse Practitioner

## 2022-04-07 ENCOUNTER — Ambulatory Visit
Admission: EM | Admit: 2022-04-07 | Discharge: 2022-04-07 | Disposition: A | Discharge status: Home / Self Care | Payer: Managed Care, Other (non HMO) | Attending: Physician Assistant | Admitting: Physician Assistant

## 2022-04-07 ENCOUNTER — Encounter: Payer: Self-pay | Admitting: Emergency Medicine

## 2022-04-07 DIAGNOSIS — R220 Localized swelling, mass and lump, head: Secondary | ICD-10-CM | POA: Diagnosis not present

## 2022-04-07 DIAGNOSIS — L255 Unspecified contact dermatitis due to plants, except food: Secondary | ICD-10-CM | POA: Diagnosis not present

## 2022-04-07 MED ORDER — METHYLPREDNISOLONE 4 MG PO TBPK
ORAL_TABLET | ORAL | 0 refills | Status: DC
Start: 2022-04-07 — End: 2022-07-27

## 2022-04-07 NOTE — ED Triage Notes (Signed)
Pt presents with a rash on her face x 2 days. She believes she got into some poison oak.

## 2022-04-07 NOTE — ED Provider Notes (Addendum)
[facility]    CSN: [id number] Arrival date & time: 04/07/22  0803      History   Chief Complaint Chief Complaint  Patient presents with   Rash    HPI Gabrielle Mendoza is a 56 y.o. female presenting for poison ivy or poison oak rash of her face for the past 2 to 3 days.  Patient says over the weekend she was helping to put up a deer stand and believes she came into contact with a poisonous plant because soon after she developed a rash on her face and on her left ankle.  She says her son also has the same rash and was helping her but at the deer stand.  She has been applying multiple over-the-counter topical medications to her face without relief.  Today, she has the rash covered with make-up but says it is spreading and she has noticed that along the bridge of her nose moving toward her eye.  She also reports that she has a little bit of facial swelling on the right side.  Denies any associated pain or fever.  Cannot think of any other potential triggers.  No other concerns.  HPI  Past Medical History:  Diagnosis Date   Anemia    Asthma    Hypertension    Hypothyroidism    Stress incontinence    Thyroid disease     Patient Active Problem List   Diagnosis Date Noted   Situational anxiety 02/10/2022   Irritable bowel syndrome with both constipation and diarrhea 09/17/2021   B12 deficiency 09/14/2021   Type O blood, Rh negative 09/14/2021   Osteopenia of left forearm 01/23/2021   S/P parathyroidectomy (HCC) 12/19/2019   Acquired hypothyroidism 07/31/2019   Rhinitis, allergic 07/31/2019   Essential hypertension 12/02/2018   History of anemia 07/23/2017   Stress incontinence 09/05/2012    Past Surgical History:  Procedure Laterality Date   CARDIAC CATHETERIZATION     CHONDROPLASTY Right 07/31/2019   Procedure: CHONDROPLASTY;  Surgeon: Donato Heinz, MD;  Location: [facility];  Service: Orthopedics;  Laterality: Right;   KNEE ARTHROSCOPY WITH MEDIAL  MENISECTOMY Right 07/31/2019   Procedure: KNEE ARTHROSCOPY WITH MEDIAL MENISECTOMY;  Surgeon: Donato Heinz, MD;  Location: [facility];  Service: Orthopedics;  Laterality: Right;    OB History   No obstetric history on file.      Home Medications    Prior to Admission medications   Medication Sig Start Date End Date Taking? Authorizing Provider  EUTHYROX 112 MCG tablet Take 112 mcg by mouth every morning. 01/15/21  Yes [provider]  losartan (COZAAR) 50 MG tablet Take 1 tablet (50 mg total) by mouth daily. 02/09/22  Yes Cannady, Corrie Dandy T, NP  methylPREDNISolone (MEDROL DOSEPAK) 4 MG TBPK tablet Take according to Dosepak instructions 04/07/22  Yes Eusebio Friendly B, PA-C  albuterol (VENTOLIN HFA) 108 (90 Base) MCG/ACT inhaler Inhale 2 puffs into the lungs every 6 (six) hours as needed for wheezing or shortness of breath.    [provider]  citalopram (CELEXA) 10 MG tablet Take 1 tablet (10 mg total) by mouth daily. 02/10/22   Cannady, Corrie Dandy T, NP  ibuprofen (ADVIL) 200 MG tablet Take 400 mg by mouth every 6 (six) hours as needed for moderate pain.    [provider]    Family History Family History  Problem Relation Age of Onset   Diabetes Mother    Hypertension Mother    Heart disease Mother  Bone cancer Mother    Hyperthyroidism Sister    Heart attack Sister    Breast cancer Neg Hx     Social History Social History   Tobacco Use   Smoking status: Former    Types: Cigarettes    Quit date: 07/27/2009    Years since quitting: 12.7   Smokeless tobacco: Never  Vaping Use   Vaping Use: Former   Quit date: 07/27/2014  Substance Use Topics   Alcohol use: No   Drug use: Never     Allergies   Prednisone and Tramadol   Review of Systems Review of Systems  Constitutional:  Negative for fatigue and fever.  HENT:  Positive for facial swelling.   Skin:  Positive for rash.  Neurological:  Negative for headaches.     Physical Exam Triage  Vital Signs ED Triage Vitals  Enc Vitals Group     BP      Pulse      Resp      Temp      Temp src      SpO2      Weight      Height      Head Circumference      Peak Flow      Pain Score      Pain Loc      Pain Edu?      Excl. in GC?    No data found.  Updated Vital Signs BP (!) 147/80 (BP Location: Left Arm)   Pulse 85   Temp 98.3 F (36.8 C) (Oral)   Resp 16   LMP 12/21/2017 Comment: irregular periods  SpO2 97%     Physical Exam Vitals and nursing note reviewed.  Constitutional:      General: She is not in acute distress.    Appearance: Normal appearance. She is not ill-appearing or toxic-appearing.  HENT:     Head: Normocephalic and atraumatic.     Nose: Nose normal.     Mouth/Throat:     Mouth: Mucous membranes are moist.     Pharynx: Oropharynx is clear.  Eyes:     General: No scleral icterus.       Right eye: No discharge.        Left eye: No discharge.     Conjunctiva/sclera: Conjunctivae normal.  Cardiovascular:     Rate and Rhythm: Normal rate and regular rhythm.     Heart sounds: Normal heart sounds.  Pulmonary:     Effort: Pulmonary effort is normal. No respiratory distress.     Breath sounds: Normal breath sounds.  Musculoskeletal:     Cervical back: Neck supple.  Skin:    General: Skin is dry.     Findings: Rash present.     Comments: As mentioned, patient has make-up on her face but I am still able to clearly see numerous areas of erythematous papules and vesicles on her face especially of her brow and bridge of her nose.  She also has 1 small area of vesicles on the left ankle.  Neurological:     General: No focal deficit present.     Mental Status: She is alert. Mental status is at baseline.     Motor: No weakness.     Gait: Gait normal.  Psychiatric:        Mood and Affect: Mood normal.        Behavior: Behavior normal.        Thought Content: Thought content normal.  UC Treatments / Results  Labs (all labs ordered are  listed, but only abnormal results are displayed) Labs Reviewed - No data to display  EKG   Radiology No results found.  Procedures Procedures (including critical care time)  Medications Ordered in UC Medications - No data to display  Initial Impression / Assessment and Plan / UC Course  I have reviewed the triage vital signs and the nursing notes.  Pertinent labs & imaging results that were available during my care of the patient were reviewed by me and considered in my medical decision making (see chart for details).   56 year old female presents for poison ivy/oak rash on her face for the past couple days.  Has not had any improvement in symptoms with over-the-counter topical treatments.  Most of patient's rash is localized to her face but she does have a spot on her ankle.  She does have her face covered with make-up so I am unable to see all of the rash but she does clearly have numerous areas on her face and her brow and bridge of her nose.  We discussed using a low point topical corticosteroid versus taking oral corticosteroids.  She says she wants to clear up faster would rather take the oral medication.  I have agreed to send that.  Sent Medrol.  Also advised low-dose hydrocortisone cream mixed with a moisturizer and cool compresses.  Advised follow-up as needed especially if rash is worsening or not improving over the next week.   Final Clinical Impressions(s) / UC Diagnoses   Final diagnoses:  Contact dermatitis due to plants, except food, unspecified contact dermatitis type  Facial swelling     Discharge Instructions      -Since this is affecting your face, I sent corticosteroids for a few days. -May use cool compresses.  Try not to scratch. - I would try to avoid applying make-up or anything to your face other than hydrocortisone cream mixed with a little bit of moisturizer to those areas but be careful in your eyes. -Continue antihistamines for the itching. -  Follow-up for any worsening of your symptoms.     ED Prescriptions     Medication Sig Dispense Auth. Provider   methylPREDNISolone (MEDROL DOSEPAK) 4 MG TBPK tablet Take according to Dosepak instructions 21 tablet Danton Clap, PA-C      PDMP not reviewed this encounter.   Danton Clap, PA-C 04/07/22 0904    Danton Clap, PA-C 04/07/22 [phone]

## 2022-04-07 NOTE — Discharge Instructions (Signed)
-  Since this is affecting your face, I sent corticosteroids for a few days. -May use cool compresses.  Try not to scratch. - I would try to avoid applying make-up or anything to your face other than hydrocortisone cream mixed with a little bit of moisturizer to those areas but be careful in your eyes. -Continue antihistamines for the itching. - Follow-up for any worsening of your symptoms.

## 2022-06-05 ENCOUNTER — Encounter: Payer: Self-pay | Admitting: Nurse Practitioner

## 2022-07-25 NOTE — Patient Instructions (Incomplete)
Please call to schedule your mammogram and/or bone density: [facility]  Address: [address], [place], [place] [address] Phone: [phone]  [facility] [place] [address] [place],  [place]  [address] Phone: [phone]    Breast Tenderness Breast tenderness is a common problem for women of all ages, but may also occur in men. Breast tenderness has many possible causes, including hormone changes, infections, taking certain medicines, and caffeine intake. In women, the pain usually comes and goes with the menstrual cycle, but it can also be constant. Breast tenderness may range from mild discomfort to severe pain. You may have tests, such as a mammogram or an ultrasound, to check for any unusual findings. Having breast tenderness usually does not mean that you have breast cancer. Follow these instructions at home: Managing pain and discomfort  If directed, put ice on the painful area. To do this: Put ice in a plastic bag. Place a towel between your skin and the bag. Leave the ice on for 20 minutes, 2-3 times a day. If your skin turns bright red, remove the ice right away to prevent skin damage. The risk of skin damage is higher if you cannot feel pain, heat, or cold. Wear a supportive bra or chest support: During exercise. While sleeping, if your breasts are very tender. Medicines Take over-the-counter and prescription medicines only as told by your health care provider. If the cause of your pain is an infection, you may be prescribed an antibiotic medicine. If you were prescribed antibiotics, take them as told by your health care provider. Do not stop using the antibiotic even if you start to feel better. Eating and drinking Decrease the amount of caffeine in your diet. Instead, drink more water and choose caffeine-free drinks. Your health care provider may recommend that you lessen the amount of fat in your  diet. You can do this by: Limiting fried foods. Cooking foods using methods such as baking, boiling, grilling, and broiling. General instructions  Keep a log of the days and times when your breasts are most tender. Ask your health care provider how to do breast exams at home. This will help you notice if you have an unusual growth or lump. Keep all follow-up visits. Contact a health care provider if: Any part of your breast is hard, red, and hot to the touch. This may be a sign of infection. You are a woman and have a new or painful lump in your breast that remains after your menstrual period ends. You are not breastfeeding and you have fluid, especially blood or pus, coming out of your nipples. You have a fever. Your pain does not improve or it gets worse. Your pain is interfering with your daily activities. Summary Breast tenderness may range from mild discomfort to severe pain. Breast tenderness has many possible causes, including hormone changes, infections, taking certain medicines, and caffeine intake. It can be treated with ice, wearing a supportive bra or chest support, and medicines. Make changes to your diet as told by your health care provider. This information is not intended to replace advice given to you by your health care provider. Make sure you discuss any questions you have with your health care provider. Document Revised: 08/27/2021 Document Reviewed: 08/27/2021 Elsevier Patient Education  2023 Elsevier Inc.  

## 2022-07-27 ENCOUNTER — Ambulatory Visit: Payer: Managed Care, Other (non HMO) | Admitting: Nurse Practitioner

## 2022-07-27 ENCOUNTER — Encounter: Payer: Self-pay | Admitting: Nurse Practitioner

## 2022-07-27 VITALS — BP 131/75 | HR 70 | Temp 98.0°F | Ht 62.01 in | Wt 161.8 lb

## 2022-07-27 DIAGNOSIS — N644 Mastodynia: Secondary | ICD-10-CM | POA: Insufficient documentation

## 2022-07-27 DIAGNOSIS — Z1231 Encounter for screening mammogram for malignant neoplasm of breast: Secondary | ICD-10-CM | POA: Diagnosis not present

## 2022-07-27 NOTE — Progress Notes (Signed)
BP 131/75   Pulse 70   Temp 98 F (36.7 C) (Oral)   Ht 5' 2.01" (1.575 m)   Wt 161 lb 12.8 oz (73.4 kg)   LMP 12/21/2017 Comment: irregular periods  SpO2 99%   BMI 29.59 kg/m    Subjective:    Patient ID: Gabrielle Mendoza, female    DOB: May 03, 1966, 57 y.o.   MRN: [id number]  HPI: Gabrielle Mendoza is a 57 y.o. female  Chief Complaint  Patient presents with   Breast Tenderness    B/L breast tenderness at the nipples, only to the touch   BREAST PAIN Having bilateral breast tenderness to nipple area for past two weeks.  To touch presents -- not bra touching, but her touching area.  Maternal grandmother passed from cancer, may have been breast cancer. Duration :weeks Location: bilateral Onset: sudden Severity: 5/10 Quality: dull, aching, and tender Frequency: intermittent Redness: no Swelling: no Trauma: no trauma Breastfeeding: no Associated with menstral cycle:  no longer has these , does still have hot flashes Nipple discharge: no Breast lump: no Status: stable Treatments attempted: none Previous mammogram: yes = 06/25/21  Relevant past medical, surgical, family and social history reviewed and updated as indicated. Interim medical history since our last visit reviewed. Allergies and medications reviewed and updated.  Review of Systems  Constitutional:  Negative for activity change, appetite change, diaphoresis, fatigue and fever.  Respiratory:  Negative for cough, chest tightness and shortness of breath.   Cardiovascular:  Negative for chest pain, palpitations and leg swelling.  Gastrointestinal: Negative.   Neurological: Negative.   Psychiatric/Behavioral: Negative.      Per HPI unless specifically indicated above     Objective:    BP 131/75   Pulse 70   Temp 98 F (36.7 C) (Oral)   Ht 5' 2.01" (1.575 m)   Wt 161 lb 12.8 oz (73.4 kg)   LMP 12/21/2017 Comment: irregular periods  SpO2 99%   BMI 29.59 kg/m   Wt Readings from Last 3 Encounters:   07/27/22 161 lb 12.8 oz (73.4 kg)  11/21/21 165 lb 3.2 oz (74.9 kg)  09/17/21 165 lb 12.8 oz (75.2 kg)    Physical Exam Vitals and nursing note reviewed.  Constitutional:      General: She is awake. She is not in acute distress.    Appearance: She is well-developed and well-groomed. She is not ill-appearing or toxic-appearing.  HENT:     Head: Normocephalic.     Right Ear: Hearing and external ear normal.     Left Ear: Hearing and external ear normal.  Eyes:     General: Lids are normal.        Right eye: No discharge.        Left eye: No discharge.     Conjunctiva/sclera: Conjunctivae normal.     Pupils: Pupils are equal, round, and reactive to light.  Neck:     Vascular: No carotid bruit.  Cardiovascular:     Rate and Rhythm: Normal rate and regular rhythm.     Heart sounds: Normal heart sounds. No murmur heard.    No gallop.  Pulmonary:     Effort: Pulmonary effort is normal. No accessory muscle usage or respiratory distress.     Breath sounds: Normal breath sounds.  Chest:  Breasts:    Right: Normal.     Left: Normal.  Abdominal:     General: Bowel sounds are normal.     Palpations: Abdomen is soft.  There is no hepatomegaly or splenomegaly.  Musculoskeletal:     Cervical back: Normal range of motion and neck supple.     Right lower leg: No edema.     Left lower leg: No edema.  Lymphadenopathy:     Upper Body:     Right upper body: No supraclavicular, axillary or pectoral adenopathy.     Left upper body: No supraclavicular, axillary or pectoral adenopathy.  Skin:    General: Skin is warm and dry.  Neurological:     Mental Status: She is alert and oriented to person, place, and time.  Psychiatric:        Attention and Perception: Attention normal.        Mood and Affect: Mood normal.        Behavior: Behavior normal. Behavior is cooperative.        Thought Content: Thought content normal.        Judgment: Judgment normal.     Results for orders placed or  performed in visit on 11/21/21  Urine Culture   Specimen: Urine   UR  Result Value Ref Range   Urine Culture, Routine Final report (A)    Organism ID, Bacteria Escherichia coli (A)    ORGANISM ID, BACTERIA Comment    Antimicrobial Susceptibility Comment   Microscopic Examination  Result Value Ref Range   WBC, UA 0-5 0 - 5 /hpf   RBC, Urine 0-2 0 - 2 /hpf   Epithelial Cells (non renal) None seen 0 - 10 /hpf   Bacteria, UA Few (A) None seen/Few  WET PREP FOR TRICH, YEAST, CLUE   Sterile Swab  Result Value Ref Range   Trichomonas Exam Negative Negative   Yeast Exam Negative Negative   Clue Cell Exam Negative Negative  Urinalysis, Routine w reflex microscopic  Result Value Ref Range   Specific Gravity, UA 1.020 1.005 - 1.030   pH, UA 7.0 5.0 - 7.5   Color, UA Yellow Yellow   Appearance Ur Clear Clear   Leukocytes,UA Trace (A) Negative   Protein,UA Negative Negative/Trace   Glucose, UA Negative Negative   Ketones, UA Negative Negative   RBC, UA Trace (A) Negative   Bilirubin, UA Negative Negative   Urobilinogen, Ur 0.2 0.2 - 1.0 mg/dL   Nitrite, UA Negative Negative   Microscopic Examination See below:       Assessment & Plan:   Problem List Items Addressed This Visit       Other   Nipple tenderness - Primary    For 2 weeks bilateral to touch only, due for mammogram and recommend she obtain.  Normal breast exam bilaterally.  Recommend use of Ibuprofen or Tylenol as needed.  Avoid touch to area unless needed.  Discussed hormone shifts after menopause.      Other Visit Diagnoses     Encounter for screening mammogram for malignant neoplasm of breast       Mammogram ordered   Relevant Orders   MM 3D SCREEN BREAST BILATERAL        Follow up plan: Return if symptoms worsen or fail to improve.

## 2022-07-27 NOTE — Assessment & Plan Note (Signed)
For 2 weeks bilateral to touch only, due for mammogram and recommend she obtain.  Normal breast exam bilaterally.  Recommend use of Ibuprofen or Tylenol as needed.  Avoid touch to area unless needed.  Discussed hormone shifts after menopause.

## 2022-08-05 ENCOUNTER — Ambulatory Visit
Admission: RE | Admit: 2022-08-05 | Discharge: 2022-08-05 | Disposition: A | Discharge status: Home / Self Care | Payer: Managed Care, Other (non HMO) | Source: Ambulatory Visit | Attending: Nurse Practitioner | Admitting: Nurse Practitioner

## 2022-08-05 DIAGNOSIS — Z1231 Encounter for screening mammogram for malignant neoplasm of breast: Secondary | ICD-10-CM | POA: Insufficient documentation

## 2022-08-06 ENCOUNTER — Ambulatory Visit: Payer: Managed Care, Other (non HMO)

## 2022-08-07 NOTE — Progress Notes (Signed)
Contacted via MyChart   Normal mammogram, may repeat in one year:)

## 2022-09-25 ENCOUNTER — Encounter: Payer: Managed Care, Other (non HMO) | Admitting: Nurse Practitioner

## 2022-10-10 NOTE — Patient Instructions (Signed)
Be Involved in Your Health Care:  Taking Medications When medications are taken as directed, they can greatly improve your health. But if they are not taken as instructed, they may not work. In some cases, not taking them correctly can be harmful. To help ensure your treatment remains effective and safe, understand your medications and how to take them.  Your lab results, notes and after visit summary will be available on My Chart. We strongly encourage you to use this feature. If lab results are abnormal the clinic will contact you with the appropriate steps. If the clinic does not contact you assume the results are satisfactory. You can always see your results on My Chart. If you have questions regarding your condition, please contact the clinic during office hours. You can also ask questions on My Chart.  We at [facility] are grateful that you chose us to provide care. We strive to provide excellent and compassionate care and are always looking for feedback. If you get a survey from the clinic please complete this.   DASH Eating Plan DASH stands for Dietary Approaches to Stop Hypertension. The DASH eating plan is a healthy eating plan that has been shown to: Reduce high blood pressure (hypertension). Reduce your risk for type 2 diabetes, heart disease, and stroke. Help with weight loss. What are tips for following this plan? Reading food labels Check food labels for the amount of salt (sodium) per serving. Choose foods with less than 5 percent of the Daily Value of sodium. Generally, foods with less than 300 milligrams (mg) of sodium per serving fit into this eating plan. To find whole grains, look for the word "whole" as the first word in the ingredient list. Shopping Buy products labeled as "low-sodium" or "no salt added." Buy fresh foods. Avoid canned foods and pre-made or frozen meals. Cooking Avoid adding salt when cooking. Use salt-free seasonings or herbs instead of  table salt or sea salt. Check with your health care provider or pharmacist before using salt substitutes. Do not fry foods. Cook foods using healthy methods such as baking, boiling, grilling, roasting, and broiling instead. Cook with heart-healthy oils, such as olive, canola, avocado, soybean, or sunflower oil. Meal planning  Eat a balanced diet that includes: 4 or more servings of fruits and 4 or more servings of vegetables each day. Try to fill one-half of your plate with fruits and vegetables. 6-8 servings of whole grains each day. Less than 6 oz (170 g) of lean meat, poultry, or fish each day. A 3-oz (85-g) serving of meat is about the same size as a deck of cards. One egg equals 1 oz (28 g). 2-3 servings of low-fat dairy each day. One serving is 1 cup (237 mL). 1 serving of nuts, seeds, or beans 5 times each week. 2-3 servings of heart-healthy fats. Healthy fats called omega-3 fatty acids are found in foods such as walnuts, flaxseeds, fortified milks, and eggs. These fats are also found in cold-water fish, such as sardines, salmon, and mackerel. Limit how much you eat of: Canned or prepackaged foods. Food that is high in trans fat, such as some fried foods. Food that is high in saturated fat, such as fatty meat. Desserts and other sweets, sugary drinks, and other foods with added sugar. Full-fat dairy products. Do not salt foods before eating. Do not eat more than 4 egg yolks a week. Try to eat at least 2 vegetarian meals a week. Eat more home-cooked food and less restaurant,   buffet, and fast food. Lifestyle When eating at a restaurant, ask that your food be prepared with less salt or no salt, if possible. If you drink alcohol: Limit how much you use to: 0-1 drink a day for women who are not pregnant. 0-2 drinks a day for men. Be aware of how much alcohol is in your drink. In the [place] one drink equals one 12 oz bottle of beer (355 mL), one 5 oz glass of wine (148 mL), or one 1 oz  glass of hard liquor (44 mL). General information Avoid eating more than 2,300 mg of salt a day. If you have hypertension, you may need to reduce your sodium intake to 1,500 mg a day. Work with your health care provider to maintain a healthy body weight or to lose weight. Ask what an ideal weight is for you. Get at least 30 minutes of exercise that causes your heart to beat faster (aerobic exercise) most days of the week. Activities may include walking, swimming, or biking. Work with your health care provider or dietitian to adjust your eating plan to your individual calorie needs. What foods should I eat? Fruits All fresh, dried, or frozen fruit. Canned fruit in natural juice (without added sugar). Vegetables Fresh or frozen vegetables (raw, steamed, roasted, or grilled). Low-sodium or reduced-sodium tomato and vegetable juice. Low-sodium or reduced-sodium tomato sauce and tomato paste. Low-sodium or reduced-sodium canned vegetables. Grains Whole-grain or whole-wheat bread. Whole-grain or whole-wheat pasta. Brown rice. Oatmeal. Quinoa. Bulgur. Whole-grain and low-sodium cereals. Pita bread. Low-fat, low-sodium crackers. Whole-wheat flour tortillas. Meats and other proteins Skinless chicken or turkey. Ground chicken or turkey. Pork with fat trimmed off. Fish and seafood. Egg whites. Dried beans, peas, or lentils. Unsalted nuts, nut butters, and seeds. Unsalted canned beans. Lean cuts of beef with fat trimmed off. Low-sodium, lean precooked or cured meat, such as sausages or meat loaves. Dairy Low-fat (1%) or fat-free (skim) milk. Reduced-fat, low-fat, or fat-free cheeses. Nonfat, low-sodium ricotta or cottage cheese. Low-fat or nonfat yogurt. Low-fat, low-sodium cheese. Fats and oils Soft margarine without trans fats. Vegetable oil. Reduced-fat, low-fat, or light mayonnaise and salad dressings (reduced-sodium). Canola, safflower, olive, avocado, soybean, and sunflower oils. Avocado. Seasonings and  condiments Herbs. Spices. Seasoning mixes without salt. Other foods Unsalted popcorn and pretzels. Fat-free sweets. The items listed above may not be a complete list of foods and beverages you can eat. Contact a dietitian for more information. What foods should I avoid? Fruits Canned fruit in a light or heavy syrup. Fried fruit. Fruit in cream or butter sauce. Vegetables Creamed or fried vegetables. Vegetables in a cheese sauce. Regular canned vegetables (not low-sodium or reduced-sodium). Regular canned tomato sauce and paste (not low-sodium or reduced-sodium). Regular tomato and vegetable juice (not low-sodium or reduced-sodium). Pickles. Olives. Grains Baked goods made with fat, such as croissants, muffins, or some breads. Dry pasta or rice meal packs. Meats and other proteins Fatty cuts of meat. Ribs. Fried meat. Bacon. Bologna, salami, and other precooked or cured meats, such as sausages or meat loaves. Fat from the back of a pig (fatback). Bratwurst. Salted nuts and seeds. Canned beans with added salt. Canned or smoked fish. Whole eggs or egg yolks. Chicken or turkey with skin. Dairy Whole or 2% milk, cream, and half-and-half. Whole or full-fat cream cheese. Whole-fat or sweetened yogurt. Full-fat cheese. Nondairy creamers. Whipped toppings. Processed cheese and cheese spreads. Fats and oils Butter. Stick margarine. Lard. Shortening. Ghee. Bacon fat. Tropical oils, such as coconut, palm   kernel, or palm oil. Seasonings and condiments Onion salt, garlic salt, seasoned salt, table salt, and sea salt. Worcestershire sauce. Tartar sauce. Barbecue sauce. Teriyaki sauce. Soy sauce, including reduced-sodium. Steak sauce. Canned and packaged gravies. Fish sauce. Oyster sauce. Cocktail sauce. Store-bought horseradish. Ketchup. Mustard. Meat flavorings and tenderizers. Bouillon cubes. Hot sauces. Pre-made or packaged marinades. Pre-made or packaged taco seasonings. Relishes. Regular salad  dressings. Other foods Salted popcorn and pretzels. The items listed above may not be a complete list of foods and beverages you should avoid. Contact a dietitian for more information. Where to find more information National Heart, Lung, and Blood Institute: www.nhlbi.nih.gov American Heart Association: www.heart.org Academy of Nutrition and Dietetics: www.eatright.org National Kidney Foundation: www.kidney.org Summary The DASH eating plan is a healthy eating plan that has been shown to reduce high blood pressure (hypertension). It may also reduce your risk for type 2 diabetes, heart disease, and stroke. When on the DASH eating plan, aim to eat more fresh fruits and vegetables, whole grains, lean proteins, low-fat dairy, and heart-healthy fats. With the DASH eating plan, you should limit salt (sodium) intake to 2,300 mg a day. If you have hypertension, you may need to reduce your sodium intake to 1,500 mg a day. Work with your health care provider or dietitian to adjust your eating plan to your individual calorie needs. This information is not intended to replace advice given to you by your health care provider. Make sure you discuss any questions you have with your health care provider. Document Revised: 05/19/2019 Document Reviewed: 05/19/2019 Elsevier Patient Education  2023 Elsevier Inc.  

## 2022-10-12 ENCOUNTER — Encounter: Payer: Self-pay | Admitting: Nurse Practitioner

## 2022-10-14 ENCOUNTER — Encounter: Payer: Self-pay | Admitting: Nurse Practitioner

## 2022-10-14 ENCOUNTER — Ambulatory Visit (INDEPENDENT_AMBULATORY_CARE_PROVIDER_SITE_OTHER): Payer: Managed Care, Other (non HMO) | Admitting: Nurse Practitioner

## 2022-10-14 VITALS — BP 129/84 | HR 67 | Temp 97.5°F | Ht 62.01 in | Wt 152.9 lb

## 2022-10-14 DIAGNOSIS — Z0000 Encounter for general adult medical examination without abnormal findings: Secondary | ICD-10-CM | POA: Diagnosis not present

## 2022-10-14 DIAGNOSIS — M85832 Other specified disorders of bone density and structure, left forearm: Secondary | ICD-10-CM | POA: Diagnosis not present

## 2022-10-14 DIAGNOSIS — E039 Hypothyroidism, unspecified: Secondary | ICD-10-CM | POA: Diagnosis not present

## 2022-10-14 DIAGNOSIS — F418 Other specified anxiety disorders: Secondary | ICD-10-CM | POA: Diagnosis not present

## 2022-10-14 DIAGNOSIS — I10 Essential (primary) hypertension: Secondary | ICD-10-CM

## 2022-10-14 DIAGNOSIS — E538 Deficiency of other specified B group vitamins: Secondary | ICD-10-CM

## 2022-10-14 DIAGNOSIS — K582 Mixed irritable bowel syndrome: Secondary | ICD-10-CM

## 2022-10-14 DIAGNOSIS — Z9089 Acquired absence of other organs: Secondary | ICD-10-CM

## 2022-10-14 DIAGNOSIS — Z9889 Other specified postprocedural states: Secondary | ICD-10-CM

## 2022-10-14 MED ORDER — LOSARTAN POTASSIUM 50 MG PO TABS: 50.0000 mg | ORAL_TABLET | Freq: Every day | ORAL | 4 refills | Status: DC

## 2022-10-14 NOTE — Assessment & Plan Note (Signed)
Stable at this time, no current medications.  Scores overall improved and denies SI/HI.  Continue to monitor.

## 2022-10-14 NOTE — Assessment & Plan Note (Signed)
Chronic, stable.  BP at goal in office and on home readings.  Recommend she monitor BP at least a few mornings a week at home and document.  DASH diet at home.  Continue current medication regimen and adjust as needed.  Labs: CBC, CMP, TSH.  Refills sent in.

## 2022-10-14 NOTE — Progress Notes (Signed)
BP 129/84   Pulse 67   Temp (!) 97.5 F (36.4 C) (Oral)   Ht 5' 2.01" (1.575 m)   Wt 152 lb 14.4 oz (69.4 kg)   LMP 12/21/2017 Comment: irregular periods  SpO2 99%   BMI 27.96 kg/m    Subjective:    Patient ID: Gabrielle Mendoza, female    DOB: 30-Apr-1966, 57 y.o.   MRN: [id number]  HPI: Gabrielle Mendoza is a 57 y.o. female presenting on 10/14/2022 for comprehensive medical examination. Current medical complaints include: none  She currently lives with: husband Menopausal Symptoms: no  HYPOTHYROIDISM Continues on Levothyroxine 112 MCG daily.     Had parathyroid surgery on the 12/04/2019, no further PTH on labs.  She follows with Dr. Smith Robert, endo, at [facility] -- they monitor all thyroid and parathyroid labs.  Last visit was 12/23/21 -- at present PCP can take over labs and if any concerns can return to endo as needed.  Thyroid control status: stable Satisfied with current treatment? yes Medication side effects: no Medication compliance: good compliance Etiology of hypothyroidism: as above Recent dose adjustment:no Fatigue: no Cold intolerance: no Heat intolerance: no Weight gain: no Weight loss: no Constipation: occasional Diarrhea/loose stools: no Palpitations: no Lower extremity edema: no Anxiety/depressed mood: yes   HYPERTENSION Continues on Losartan 50 MG daily. History of smoking -- smoked for 10 to 15 years, smoked 1 PPD, quit > 10 years ago. Family history of cardiac disease -- mother and sister + diabetes.   Does have seasonal asthma reported, more noted when hot outside -- uses Albuterol as needed, minimally uses. Hypertension status: stable  Satisfied with current treatment? yes Duration of hypertension: chronic BP monitoring frequency: occasionally BP range: <130/80 at home BP medication side effects:  no Medication compliance: good compliance Previous BP meds:Losartan Aspirin: no Recurrent headaches: no Visual changes: no Palpitations: no Dyspnea:  no Chest pain: no Lower extremity edema: no Dizzy/lightheaded: no  OSTEOPENIA Noted on DEXA 02/27/2019.  Not taking supplements at home.  Adequate calcium & vitamin D: no Weight bearing exercises: yes      10/14/2022    3:59 PM 02/10/2022    8:50 AM 09/17/2021    1:32 PM 02/05/2021    1:19 PM  Depression screen PHQ 2/9  Decreased Interest 0 2 0 0  Down, Depressed, Hopeless 0 0 0 0  PHQ - 2 Score 0 2 0 0  Altered sleeping 0 3 0   Tired, decreased energy 0 3 0   Change in appetite 0 3 0   Feeling bad or failure about yourself  0 0 0   Trouble concentrating 0 0 0   Moving slowly or fidgety/restless 0 0 0   Suicidal thoughts 0 0 0   PHQ-9 Score 0 11 0   Difficult doing work/chores Not difficult at all Not difficult at all         10/14/2022    4:00 PM 02/10/2022    8:52 AM 09/17/2021    1:32 PM  GAD 7 : Generalized Anxiety Score  Nervous, Anxious, on Edge 0 3 0  Control/stop worrying 0 3 0  Worry too much - different things 0 3 0  Trouble relaxing 0 3 0  Restless 0 3 0  Easily annoyed or irritable 0 3 0  Afraid - awful might happen 0 3 0  Total GAD 7 Score 0 21 0  Anxiety Difficulty Not difficult at all Not difficult at all  02/05/2021    1:19 PM 02/10/2022    8:50 AM 04/07/2022    8:18 AM 10/14/2022    3:55 PM  Fall Risk  Falls in the past year? 0 0  0  Was there an injury with Fall?  0  0  Fall Risk Category Calculator  0  0  Fall Risk Category (Retired)  Low    (RETIRED) Patient Fall Risk Level Low fall risk  Low fall risk   Patient at Risk for Falls Due to No Fall Risks History of fall(s)  No Fall Risks  Fall risk Follow up Falls evaluation completed Falls evaluation completed  Education provided    Past Medical History:  Past Medical History:  Diagnosis Date   Anemia    Asthma    Hypertension    Hypothyroidism    Stress incontinence    Thyroid disease     Surgical History:  Past Surgical History:  Procedure Laterality Date   CARDIAC  CATHETERIZATION     CHONDROPLASTY Right 07/31/2019   Procedure: CHONDROPLASTY;  Surgeon: Donato Heinz, MD;  Location: [facility] ORS;  Service: Orthopedics;  Laterality: Right;   KNEE ARTHROSCOPY WITH MEDIAL MENISECTOMY Right 07/31/2019   Procedure: KNEE ARTHROSCOPY WITH MEDIAL MENISECTOMY;  Surgeon: Donato Heinz, MD;  Location: [facility] ORS;  Service: Orthopedics;  Laterality: Right;    Medications:  Current Outpatient Medications on File Prior to Visit  Medication Sig   albuterol (VENTOLIN HFA) 108 (90 Base) MCG/ACT inhaler Inhale 2 puffs into the lungs every 6 (six) hours as needed for wheezing or shortness of breath.   EUTHYROX 112 MCG tablet Take 112 mcg by mouth every morning.   ibuprofen (ADVIL) 200 MG tablet Take 400 mg by mouth every 6 (six) hours as needed for moderate pain.   No current facility-administered medications on file prior to visit.    Allergies:  Allergies  Allergen Reactions   Prednisone Other (See Comments)    Mood irritability with this -- would like to discuss before this is prescribed.   Tramadol Nausea Only    Social History:  Social History   Socioeconomic History   Marital status: Married    Spouse name: Not on file   Number of children: 3   Years of education: Not on file   Highest education level: Not on file  Occupational History   Not on file  Tobacco Use   Smoking status: Former    Types: Cigarettes    Quit date: 07/27/2009    Years since quitting: 13.2   Smokeless tobacco: Never  Vaping Use   Vaping Use: Former   Quit date: 07/27/2014  Substance and Sexual Activity   Alcohol use: No   Drug use: Never   Sexual activity: Yes  Other Topics Concern   Not on file  Social History Narrative   Not on file   Social Determinants of Health   Financial Resource Strain: Low Risk  (02/05/2021)   Overall Financial Resource Strain (CARDIA)    Difficulty of Paying Living Expenses: Not hard at all  Food Insecurity: No Food Insecurity (02/05/2021)    Hunger Vital Sign    Worried About Running Out of Food in the Last Year: Never true    Ran Out of Food in the Last Year: Never true  Transportation Needs: No Transportation Needs (02/05/2021)   PRAPARE - Administrator, Civil Service (Medical): No    Lack of Transportation (Non-Medical): No  Physical Activity: Sufficiently Active (02/05/2021)  Exercise Vital Sign    Days of Exercise per Week: 7 days    Minutes of Exercise per Session: 30 min  Stress: No Stress Concern Present (02/05/2021)   [employer] of Occupational Health - Occupational Stress Questionnaire    Feeling of Stress : Only a little  Social Connections: Socially Integrated (02/05/2021)   Social Connection and Isolation Panel [NHANES]    Frequency of Communication with Friends and Family: More than three times a week    Frequency of Social Gatherings with Friends and Family: More than three times a week    Attends Religious Services: More than 4 times per year    Active Member of [employer] or Organizations: Yes    Attends Banker Meetings: Never    Marital Status: Married  Catering manager Violence: Not At Risk (02/05/2021)   Humiliation, Afraid, Rape, and Kick questionnaire    Fear of Current or Ex-Partner: No    Emotionally Abused: No    Physically Abused: No    Sexually Abused: No   Social History   Tobacco Use  Smoking Status Former   Types: Cigarettes   Quit date: 07/27/2009   Years since quitting: 13.2  Smokeless Tobacco Never   Social History   Substance and Sexual Activity  Alcohol Use No    Family History:  Family History  Problem Relation Age of Onset   Diabetes Mother    Hypertension Mother    Heart disease Mother    Bone cancer Mother    Hyperthyroidism Sister    Heart attack Sister    Breast cancer Neg Hx     Past medical history, surgical history, medications, allergies, family history and social history reviewed with patient today and changes made to appropriate  areas of the chart.   ROS All other ROS negative except what is listed above and in the HPI.      Objective:    BP 129/84   Pulse 67   Temp (!) 97.5 F (36.4 C) (Oral)   Ht 5' 2.01" (1.575 m)   Wt 152 lb 14.4 oz (69.4 kg)   LMP 12/21/2017 Comment: irregular periods  SpO2 99%   BMI 27.96 kg/m   Wt Readings from Last 3 Encounters:  10/14/22 152 lb 14.4 oz (69.4 kg)  07/27/22 161 lb 12.8 oz (73.4 kg)  11/21/21 165 lb 3.2 oz (74.9 kg)    Physical Exam Vitals and nursing note reviewed. Exam conducted with a chaperone present.  Constitutional:      General: She is awake. She is not in acute distress.    Appearance: She is well-developed and well-groomed. She is not ill-appearing or toxic-appearing.  HENT:     Head: Normocephalic and atraumatic.     Right Ear: Hearing, tympanic membrane, ear canal and external ear normal. No drainage.     Left Ear: Hearing, tympanic membrane, ear canal and external ear normal. No drainage.     Nose: Nose normal.     Right Sinus: No maxillary sinus tenderness or frontal sinus tenderness.     Left Sinus: No maxillary sinus tenderness or frontal sinus tenderness.     Mouth/Throat:     Mouth: Mucous membranes are moist.     Pharynx: Oropharynx is clear. Uvula midline. No pharyngeal swelling, oropharyngeal exudate or posterior oropharyngeal erythema.  Eyes:     General: Lids are normal.        Right eye: No discharge.        Left eye: No  discharge.     Extraocular Movements: Extraocular movements intact.     Conjunctiva/sclera: Conjunctivae normal.     Pupils: Pupils are equal, round, and reactive to light.     Visual Fields: Right eye visual fields normal and left eye visual fields normal.  Neck:     Thyroid: No thyromegaly.     Vascular: No carotid bruit.     Trachea: Trachea normal.  Cardiovascular:     Rate and Rhythm: Normal rate and regular rhythm.     Heart sounds: Normal heart sounds. No murmur heard.    No gallop.  Pulmonary:      Effort: Pulmonary effort is normal. No accessory muscle usage or respiratory distress.     Breath sounds: Normal breath sounds.  Chest:  Breasts:    Right: Normal.     Left: Normal.  Abdominal:     General: Bowel sounds are normal.     Palpations: Abdomen is soft. There is no hepatomegaly or splenomegaly.     Tenderness: There is no abdominal tenderness.  Musculoskeletal:        General: Normal range of motion.     Cervical back: Normal range of motion and neck supple.     Right lower leg: No edema.     Left lower leg: No edema.  Lymphadenopathy:     Head:     Right side of head: No submental, submandibular, tonsillar, preauricular or posterior auricular adenopathy.     Left side of head: No submental, submandibular, tonsillar, preauricular or posterior auricular adenopathy.     Cervical: No cervical adenopathy.     Upper Body:     Right upper body: No supraclavicular, axillary or pectoral adenopathy.     Left upper body: No supraclavicular, axillary or pectoral adenopathy.  Skin:    General: Skin is warm and dry.     Capillary Refill: Capillary refill takes less than 2 seconds.     Findings: No rash.  Neurological:     Mental Status: She is alert and oriented to person, place, and time.     Gait: Gait is intact.     Deep Tendon Reflexes: Reflexes are normal and symmetric.     Reflex Scores:      Brachioradialis reflexes are 2+ on the right side and 2+ on the left side.      Patellar reflexes are 2+ on the right side and 2+ on the left side. Psychiatric:        Attention and Perception: Attention normal.        Mood and Affect: Mood normal.        Speech: Speech normal.        Behavior: Behavior normal. Behavior is cooperative.        Thought Content: Thought content normal.        Judgment: Judgment normal.    Results for orders placed or performed in visit on 11/21/21  Urine Culture   Specimen: Urine   UR  Result Value Ref Range   Urine Culture, Routine Final report  (A)    Organism ID, Bacteria Escherichia coli (A)    ORGANISM ID, BACTERIA Comment    Antimicrobial Susceptibility Comment   Microscopic Examination  Result Value Ref Range   WBC, UA 0-5 0 - 5 /hpf   RBC, Urine 0-2 0 - 2 /hpf   Epithelial Cells (non renal) None seen 0 - 10 /hpf   Bacteria, UA Few (A) None seen/Few  WET PREP FOR TRICH,  YEAST, CLUE   Sterile Swab  Result Value Ref Range   Trichomonas Exam Negative Negative   Yeast Exam Negative Negative   Clue Cell Exam Negative Negative  Urinalysis, Routine w reflex microscopic  Result Value Ref Range   Specific Gravity, UA 1.020 1.005 - 1.030   pH, UA 7.0 5.0 - 7.5   Color, UA Yellow Yellow   Appearance Ur Clear Clear   Leukocytes,UA Trace (A) Negative   Protein,UA Negative Negative/Trace   Glucose, UA Negative Negative   Ketones, UA Negative Negative   RBC, UA Trace (A) Negative   Bilirubin, UA Negative Negative   Urobilinogen, Ur 0.2 0.2 - 1.0 mg/dL   Nitrite, UA Negative Negative   Microscopic Examination See below:       Assessment & Plan:   Problem List Items Addressed This Visit       Cardiovascular and Mediastinum   Essential hypertension - Primary    Chronic, stable.  BP at goal in office and on home readings.  Recommend she monitor BP at least a few mornings a week at home and document.  DASH diet at home.  Continue current medication regimen and adjust as needed.  Labs: CBC, CMP, TSH.  Refills sent in.       Relevant Medications   losartan (COZAAR) 50 MG tablet   Other Relevant Orders   CBC with Differential/Platelet   Comprehensive metabolic panel   Lipid Panel w/o Chol/HDL Ratio     Endocrine   Acquired hypothyroidism    Chronic, stable.  Labs monitored by Dr. Smith Robert with endo at [facility], but okay for PCP to takeover.  Continue current medication regimen as prescribed by them.  Recent notes reviewed and labs. Thyroid labs today.      Relevant Orders   T4, free   TSH     Musculoskeletal and Integument    Osteopenia of left forearm    On DEXA 02/27/2019.  Recommend Calcium and Vit D supplementation daily + weight bearing exercises. Vitamin D level checked today.       Relevant Orders   VITAMIN D 25 Hydroxy (Vit-D Deficiency, Fractures)     Other   B12 deficiency    Ongoing, no current supplement.  Recheck level today and restart supplement as needed.      Relevant Orders   Vitamin B12   S/P parathyroidectomy    History of hyperparathyroidism, with parathyroid removed 12/04/2019.  She was followed by endo, Dr. Smith Robert with [facility], but okay for PCP to takeover and monitor thyroid labs in office.  Recent notes and labs reviewed.  Return to endo as needed.      Relevant Orders   VITAMIN D 25 Hydroxy (Vit-D Deficiency, Fractures)   Situational anxiety    Stable at this time, no current medications.  Scores overall improved and denies SI/HI.  Continue to monitor.      Other Visit Diagnoses     Encounter for annual physical exam       Annual physical today with labs and health maintenance reviewed, discussed with patient.        Follow up plan: Return in about 1 year (around 10/14/2023) for Annual physical.   LABORATORY TESTING:  - Pap smear: Up To Date  IMMUNIZATIONS:   - Tdap: Tetanus vaccination status reviewed: Up to Date within past 10 years - Influenza: Refused - Pneumovax: Not applicable - Prevnar: Not applicable - COVID: Up to date - HPV: Not applicable - Shingrix vaccine: Refused  SCREENING: -  Mammogram: Up to date 08/05/2022 last - Colonoscopy: Refused -- never performed Cologuard when provided - Bone Density: Up to date  -Hearing Test: Not applicable  -Spirometry: Not applicable   PATIENT COUNSELING:      Diet: Encouraged to adjust caloric intake to maintain  or achieve ideal body weight, to reduce intake of dietary saturated fat and total fat, to limit sodium intake by avoiding high sodium foods and not adding table salt, and to maintain adequate dietary potassium  and calcium preferably from fresh fruits, vegetables, and low-fat dairy products.    Stressed the importance of regular exercise  Injury prevention: Discussed safety belts, safety helmets, smoke detector, smoking near bedding or upholstery.   Dental health: Discussed importance of regular tooth brushing, flossing, and dental visits.    NEXT PREVENTATIVE PHYSICAL DUE IN 1 YEAR. Return in about 1 year (around 10/14/2023) for Annual physical.

## 2022-10-14 NOTE — Assessment & Plan Note (Signed)
On DEXA 02/27/2019.  Recommend Calcium and Vit D supplementation daily + weight bearing exercises. Vitamin D level checked today.

## 2022-10-14 NOTE — Assessment & Plan Note (Signed)
Ongoing, no current supplement.  Recheck level today and restart supplement as needed.

## 2022-10-14 NOTE — Assessment & Plan Note (Signed)
Chronic, stable.  Labs monitored by Dr. Smith Robert with endo at [facility], but okay for PCP to takeover.  Continue current medication regimen as prescribed by them.  Recent notes reviewed and labs. Thyroid labs today.

## 2022-10-14 NOTE — Assessment & Plan Note (Signed)
History of hyperparathyroidism, with parathyroid removed 12/04/2019.  She was followed by endo, Dr. Smith Robert with [facility], but okay for PCP to takeover and monitor thyroid labs in office.  Recent notes and labs reviewed.  Return to endo as needed.

## 2022-10-15 ENCOUNTER — Other Ambulatory Visit: Payer: Self-pay | Admitting: Nurse Practitioner

## 2022-10-15 DIAGNOSIS — E039 Hypothyroidism, unspecified: Secondary | ICD-10-CM

## 2022-10-15 LAB — CBC WITH DIFFERENTIAL/PLATELET
Basophils Absolute: 0.1 10*3/uL (ref 0.0–0.2)
Basos: 2 %
EOS (ABSOLUTE): 0.5 10*3/uL — ABNORMAL HIGH (ref 0.0–0.4)
Eos: 7 %
Hematocrit: 37.9 % (ref 34.0–46.6)
Hemoglobin: 12.8 g/dL (ref 11.1–15.9)
Immature Grans (Abs): 0 10*3/uL (ref 0.0–0.1)
Immature Granulocytes: 0 %
Lymphocytes Absolute: 2.5 10*3/uL (ref 0.7–3.1)
Lymphs: 35 %
MCH: 28.3 pg (ref 26.6–33.0)
MCHC: 33.8 g/dL (ref 31.5–35.7)
MCV: 84 fL (ref 79–97)
Monocytes Absolute: 0.6 10*3/uL (ref 0.1–0.9)
Monocytes: 8 %
Neutrophils Absolute: 3.6 10*3/uL (ref 1.4–7.0)
Neutrophils: 48 %
Platelets: 361 10*3/uL (ref 150–450)
RBC: 4.52 x10E6/uL (ref 3.77–5.28)
RDW: 12.1 % (ref 11.7–15.4)
WBC: 7.3 10*3/uL (ref 3.4–10.8)

## 2022-10-15 LAB — VITAMIN D 25 HYDROXY (VIT D DEFICIENCY, FRACTURES): Vit D, 25-Hydroxy: 38.2 ng/mL (ref 30.0–100.0)

## 2022-10-15 LAB — COMPREHENSIVE METABOLIC PANEL
ALT: 11 IU/L (ref 0–32)
AST: 10 IU/L (ref 0–40)
Albumin/Globulin Ratio: 1.8 (ref 1.2–2.2)
Albumin: 4.4 g/dL (ref 3.8–4.9)
Alkaline Phosphatase: 90 IU/L (ref 44–121)
BUN/Creatinine Ratio: 19 (ref 9–23)
BUN: 12 mg/dL (ref 6–24)
Bilirubin Total: 0.3 mg/dL (ref 0.0–1.2)
CO2: 23 mmol/L (ref 20–29)
Calcium: 9.8 mg/dL (ref 8.7–10.2)
Chloride: 103 mmol/L (ref 96–106)
Creatinine, Ser: 0.63 mg/dL (ref 0.57–1.00)
Globulin, Total: 2.4 g/dL (ref 1.5–4.5)
Glucose: 86 mg/dL (ref 70–99)
Potassium: 4.3 mmol/L (ref 3.5–5.2)
Sodium: 141 mmol/L (ref 134–144)
Total Protein: 6.8 g/dL (ref 6.0–8.5)
eGFR: 105 mL/min/{1.73_m2} (ref >59)

## 2022-10-15 LAB — LIPID PANEL W/O CHOL/HDL RATIO
Cholesterol, Total: 138 mg/dL (ref 100–199)
HDL: 48 mg/dL (ref >39)
LDL Chol Calc (NIH): 71 mg/dL (ref 0–99)
Triglycerides: 102 mg/dL (ref 0–149)
VLDL Cholesterol Cal: 19 mg/dL (ref 5–40)

## 2022-10-15 LAB — T4, FREE: Free T4: 1.89 ng/dL — ABNORMAL HIGH (ref 0.82–1.77)

## 2022-10-15 LAB — VITAMIN B12: Vitamin B-12: 306 pg/mL (ref 232–1245)

## 2022-10-15 LAB — TSH: TSH: 0.04 u[IU]/mL — ABNORMAL LOW (ref 0.450–4.500)

## 2022-10-15 MED ORDER — LEVOTHYROXINE SODIUM 100 MCG PO TABS: 100.0000 ug | ORAL_TABLET | Freq: Every day | ORAL | 3 refills | Status: DC

## 2022-10-15 NOTE — Progress Notes (Signed)
Contacted via MyChart - needs lab only visit in 6 weeks.   Good afternoon Gabrielle Mendoza, your labs have returned and unfortunately your thyroid levels are more hyperthyroid at this time -- too overactive.  We need to come down on your thyroid medication to 100 MCG and recheck labs outpatient in 6 weeks.  My staff will call to schedule this.  I am sending in new dose today. Remainder of labs look great with exception of B12 level on lower side, I do recommend you take Vitamin B12 1000 MCG daily for overall nervous system health.  Any questions?  I will fill form out and we will fax it.   Keep being amazing!!  Thank you for allowing me to participate in your care.  I appreciate you. Kindest regards, Korry Dalgleish

## 2022-10-16 NOTE — Progress Notes (Signed)
Called and scheduled patient for 11/26/2022 @ 4:00 pm.

## 2022-11-26 ENCOUNTER — Other Ambulatory Visit: Payer: Managed Care, Other (non HMO)

## 2022-11-26 DIAGNOSIS — E039 Hypothyroidism, unspecified: Secondary | ICD-10-CM

## 2022-11-27 ENCOUNTER — Other Ambulatory Visit: Payer: Self-pay | Admitting: Nurse Practitioner

## 2022-11-27 DIAGNOSIS — E039 Hypothyroidism, unspecified: Secondary | ICD-10-CM

## 2022-11-27 LAB — T4, FREE: Free T4: 1.72 ng/dL (ref 0.82–1.77)

## 2022-11-27 LAB — TSH: TSH: 0.18 u[IU]/mL — ABNORMAL LOW (ref 0.450–4.500)

## 2022-11-27 MED ORDER — LEVOTHYROXINE SODIUM 75 MCG PO TABS: 75.0000 ug | ORAL_TABLET | Freq: Every day | ORAL | 5 refills | Status: DC

## 2022-11-27 NOTE — Progress Notes (Signed)
Levothyroxine dose reduction.

## 2022-11-27 NOTE — Addendum Note (Signed)
Addended by: Aura Dials T on: 11/27/2022 07:20 AM   Modules accepted: Orders

## 2022-11-27 NOTE — Progress Notes (Signed)
Contacted via MyChart == need lab only visit in 6 weeks please   Good morning Gabrielle Mendoza, your labs have returned.  Thyroid levels show TSH still on more hyperthyroid, low, side.  Free T4 improving, but higher level of normal.  We will adjust medication to 75 MCG daily.  Stop 100 MCG dosing, then recheck levels in 6 weeks.  My staff will call to schedule lab only visit.  Any questions? Keep being awesome!!  Thank you for allowing me to participate in your care.  I appreciate you. Kindest regards, Gavyn Zoss

## 2022-11-27 NOTE — Progress Notes (Signed)
Called and scheduled lab only visit for 01/07/2023 @ 8:20 am.

## 2022-12-23 ENCOUNTER — Encounter: Payer: Self-pay | Admitting: Nurse Practitioner

## 2023-01-07 ENCOUNTER — Other Ambulatory Visit: Payer: Managed Care, Other (non HMO)

## 2023-01-11 ENCOUNTER — Other Ambulatory Visit: Payer: Managed Care, Other (non HMO)

## 2023-01-13 ENCOUNTER — Other Ambulatory Visit: Payer: Managed Care, Other (non HMO)

## 2023-01-13 DIAGNOSIS — E039 Hypothyroidism, unspecified: Secondary | ICD-10-CM

## 2023-01-14 ENCOUNTER — Other Ambulatory Visit: Payer: Self-pay | Admitting: Nurse Practitioner

## 2023-01-14 ENCOUNTER — Encounter: Payer: Self-pay | Admitting: Nurse Practitioner

## 2023-01-14 LAB — T4, FREE: Free T4: 1.18 ng/dL (ref 0.82–1.77)

## 2023-01-14 LAB — TSH: TSH: 5 u[IU]/mL — ABNORMAL HIGH (ref 0.450–4.500)

## 2023-01-14 MED ORDER — LEVOTHYROXINE SODIUM 88 MCG PO TABS: 88.0000 ug | ORAL_TABLET | Freq: Every day | ORAL | 4 refills | Status: DC

## 2023-01-14 NOTE — Progress Notes (Signed)
Called to schedule patient stated she was getting a bad connection and never came back on the phone.  Put in CRM.

## 2023-01-14 NOTE — Progress Notes (Signed)
Contacted via MyChart -- needs lab only visit in 6 weeks please   Good morning Gabrielle Mendoza, we are trying to find the sweet spot with your thyroid.  Last check we reduced medication due to low levels and this check levels are too high (sluggish thyroid). Therefore, I am going to stop your 75 MCG Levothyroxine and start 88 MCG which is in between the 75 and 100 you have recently taken. This may get [place] where we need to be.  I would like to recheck your labs outpatient in 6 weeks and my staff will call to schedule.  Any questions?  Start the 88 MCG Levothyroxine and stop 75 MCG dosing. :) Keep being amazing!!  Thank you for allowing me to participate in your care.  I appreciate you. Kindest regards, Jedidiah Demartini

## 2023-02-12 ENCOUNTER — Encounter: Payer: Self-pay | Admitting: Nurse Practitioner

## 2023-04-25 NOTE — Patient Instructions (Signed)
Be Involved in Caring For Your Health:  Taking Medications When medications are taken as directed, they can greatly improve your health. But if they are not taken as prescribed, they may not work. In some cases, not taking them correctly can be harmful. To help ensure your treatment remains effective and safe, understand your medications and how to take them. Bring your medications to each visit for review by your provider.  Your lab results, notes, and after visit summary will be available on My Chart. We strongly encourage you to use this feature. If lab results are abnormal the clinic will contact you with the appropriate steps. If the clinic does not contact you assume the results are satisfactory. You can always view your results on My Chart. If you have questions regarding your health or results, please contact the clinic during office hours. You can also ask questions on My Chart.  We at [facility] are grateful that you chose [place] to provide your care. We strive to provide evidence-based and compassionate care and are always looking for feedback. If you get a survey from the clinic please complete this so we can hear your opinions.  Healthy Eating, Adult Healthy eating may help you get and keep a healthy body weight, reduce the risk of chronic disease, and live a long and productive life. It is important to follow a healthy eating pattern. Your nutritional and calorie needs should be met mainly by different nutrient-rich foods. What are tips for following this plan? Reading food labels Read labels and choose the following: Reduced or low sodium products. Juices with 100% fruit juice. Foods with low saturated fats (<3 g per serving) and high polyunsaturated and monounsaturated fats. Foods with whole grains, such as whole wheat, cracked wheat, brown rice, and wild rice. Whole grains that are fortified with folic acid. This is recommended for females who are pregnant or who want to  become pregnant. Read labels and do not eat or drink the following: Foods or drinks with added sugars. These include foods that contain brown sugar, corn sweetener, corn syrup, dextrose, fructose, glucose, high-fructose corn syrup, honey, invert sugar, lactose, malt syrup, maltose, molasses, raw sugar, sucrose, trehalose, or turbinado sugar. Limit your intake of added sugars to less than 10% of your total daily calories. Do not eat more than the following amounts of added sugar per day: 6 teaspoons (25 g) for females. 9 teaspoons (38 g) for males. Foods that contain processed or refined starches and grains. Refined grain products, such as white flour, degermed cornmeal, white bread, and white rice. Shopping Choose nutrient-rich snacks, such as vegetables, whole fruits, and nuts. Avoid high-calorie and high-sugar snacks, such as potato chips, fruit snacks, and candy. Use oil-based dressings and spreads on foods instead of solid fats such as butter, margarine, sour cream, or cream cheese. Limit pre-made sauces, mixes, and "instant" products such as flavored rice, instant noodles, and ready-made pasta. Try more plant-protein sources, such as tofu, tempeh, black beans, edamame, lentils, nuts, and seeds. Explore eating plans such as the Mediterranean diet or vegetarian diet. Try heart-healthy dips made with beans and healthy fats like hummus and guacamole. Vegetables go great with these. Cooking Use oil to saut or stir-fry foods instead of solid fats such as butter, margarine, or lard. Try baking, boiling, grilling, or broiling instead of frying. Remove the fatty part of meats before cooking. Steam vegetables in water or broth. Meal planning  At meals, imagine dividing your plate into fourths: One-half of  your plate is fruits and vegetables. One-fourth of your plate is whole grains. One-fourth of your plate is protein, especially lean meats, poultry, eggs, tofu, beans, or nuts. Include low-fat  dairy as part of your daily diet. Lifestyle Choose healthy options in all settings, including home, work, school, restaurants, or stores. Prepare your food safely: Wash your hands after handling raw meats. Where you prepare food, keep surfaces clean by regularly washing with hot, soapy water. Keep raw meats separate from ready-to-eat foods, such as fruits and vegetables. Cook seafood, meat, poultry, and eggs to the recommended temperature. Get a food thermometer. Store foods at safe temperatures. In general: Keep cold foods at 48F (4.4C) or below. Keep hot foods at 148F (60C) or above. Keep your freezer at [facility] (-17.8C) or below. Foods are not safe to eat if they have been between the temperatures of 40-148F (4.4-60C) for more than 2 hours. What foods should I eat? Fruits Aim to eat 1-2 cups of fresh, canned (in natural juice), or frozen fruits each day. One cup of fruit equals 1 small apple, 1 large banana, 8 large strawberries, 1 cup (237 g) canned fruit,  cup (82 g) dried fruit, or 1 cup (240 mL) 100% juice. Vegetables Aim to eat 2-4 cups of fresh and frozen vegetables each day, including different varieties and colors. One cup of vegetables equals 1 cup (91 g) broccoli or cauliflower florets, 2 medium carrots, 2 cups (150 g) raw, leafy greens, 1 large tomato, 1 large bell pepper, 1 large sweet potato, or 1 medium white potato. Grains Aim to eat 5-10 ounce-equivalents of whole grains each day. Examples of 1 ounce-equivalent of grains include 1 slice of bread, 1 cup (40 g) ready-to-eat cereal, 3 cups (24 g) popcorn, or  cup (93 g) cooked rice. Meats and other proteins Try to eat 5-7 ounce-equivalents of protein each day. Examples of 1 ounce-equivalent of protein include 1 egg,  oz nuts (12 almonds, 24 pistachios, or 7 walnut halves), 1/4 cup (90 g) cooked beans, 6 tablespoons (90 g) hummus or 1 tablespoon (16 g) peanut butter. A cut of meat or fish that is the size of a deck of  cards is about 3-4 ounce-equivalents (85 g). Of the protein you eat each week, try to have at least 8 sounce (227 g) of seafood. This is about 2 servings per week. This includes salmon, trout, herring, sardines, and anchovies. Dairy Aim to eat 3 cup-equivalents of fat-free or low-fat dairy each day. Examples of 1 cup-equivalent of dairy include 1 cup (240 mL) milk, 8 ounces (250 g) yogurt, 1 ounces (44 g) natural cheese, or 1 cup (240 mL) fortified soy milk. Fats and oils Aim for about 5 teaspoons (21 g) of fats and oils per day. Choose monounsaturated fats, such as canola and olive oils, mayonnaise made with olive oil or avocado oil, avocados, peanut butter, and most nuts, or polyunsaturated fats, such as sunflower, corn, and soybean oils, walnuts, pine nuts, sesame seeds, sunflower seeds, and flaxseed. Beverages Aim for 6 eight-ounce glasses of water per day. Limit coffee to 3-5 eight-ounce cups per day. Limit caffeinated beverages that have added calories, such as soda and energy drinks. If you drink alcohol: Limit how much you have to: 0-1 drink a day if you are female. 0-2 drinks a day if you are female. Know how much alcohol is in your drink. In the [place] one drink is one 12 oz bottle of beer (355 mL), one 5 oz glass of wine (  148 mL), or one 1 oz glass of hard liquor (44 mL). Seasoning and other foods Try not to add too much salt to your food. Try using herbs and spices instead of salt. Try not to add sugar to food. This information is based on [place] nutrition guidelines. To learn more, visit [employer]. Exact amounts may vary. You may need different amounts. This information is not intended to replace advice given to you by your health care provider. Make sure you discuss any questions you have with your health care provider. Document Revised: 03/16/2022 Document Reviewed: 03/16/2022 Elsevier Patient Education  2024 [employer].

## 2023-04-27 ENCOUNTER — Ambulatory Visit: Payer: Managed Care, Other (non HMO) | Admitting: Nurse Practitioner

## 2023-04-27 ENCOUNTER — Encounter: Payer: Self-pay | Admitting: Nurse Practitioner

## 2023-04-27 VITALS — BP 134/82 | HR 68 | Temp 97.9°F | Ht 62.0 in | Wt 157.2 lb

## 2023-04-27 DIAGNOSIS — Z9089 Acquired absence of other organs: Secondary | ICD-10-CM

## 2023-04-27 DIAGNOSIS — R252 Cramp and spasm: Secondary | ICD-10-CM | POA: Diagnosis not present

## 2023-04-27 DIAGNOSIS — M2550 Pain in unspecified joint: Secondary | ICD-10-CM | POA: Insufficient documentation

## 2023-04-27 DIAGNOSIS — E039 Hypothyroidism, unspecified: Secondary | ICD-10-CM

## 2023-04-27 DIAGNOSIS — F418 Other specified anxiety disorders: Secondary | ICD-10-CM

## 2023-04-27 DIAGNOSIS — Z9889 Other specified postprocedural states: Secondary | ICD-10-CM

## 2023-04-27 MED ORDER — CITALOPRAM HYDROBROMIDE 10 MG PO TABS
10.0000 mg | ORAL_TABLET | Freq: Every day | ORAL | 3 refills | Status: DC
Start: 2023-04-27 — End: 2023-06-09

## 2023-04-27 NOTE — Assessment & Plan Note (Signed)
Refer to multiple joint pain plan of care.

## 2023-04-27 NOTE — Assessment & Plan Note (Signed)
Exacerbation of symptoms at this time.  Have discussed at length with her.  We will trial Celexa 10 MG at this time, which was ordered in past but she never took as improved.  Highly recommend she try this and see if helps overall mood and health.  Denies SI/HI.  Educated on SSRI family + BLACK BOX warning, is aware to let PCP know if any major side effects present.  Will have her return in 5 weeks to see how mood is.  She is aware can take up to 6 weeks for full benefit.

## 2023-04-27 NOTE — Assessment & Plan Note (Signed)
To lower back, both knees, and cramping to both lower legs.  ?if related to poor hydration and recent increased anxiety + recent thyroid labs not at goal -- she did not return for check as instructed.  Will check labs today: Thyroid levels, CBC, CMP, Mag.  Determine next steps after all labs returned.  Recommend she restart Magnesium 400 MG at night + ensure good hydration daily.  Can trial mustard or bar soap for cramping at night.  Recommend she take 1000 MG Tylenol before bed at this time to see if benefit.  Return in 5 weeks.

## 2023-04-27 NOTE — Assessment & Plan Note (Signed)
Chronic, stable.  Labs monitored by Dr. Smith Robert with endo at [facility] in past, but okay for PCP to takeover.  Continue current medication regimen as prescribed and will adjust if needed.  Thyroid labs today.

## 2023-04-27 NOTE — Progress Notes (Signed)
BP 134/82 (BP Location: Left Arm, Patient Position: Sitting, Cuff Size: Normal)   Pulse 68   Temp 97.9 F (36.6 C) (Oral)   Ht 5\' 2"  (1.575 m)   Wt 157 lb 3.2 oz (71.3 kg)   LMP 12/21/2017 Comment: irregular periods  SpO2 97%   BMI 28.75 kg/m    Subjective:    Patient ID: Gabrielle Mendoza, female    DOB: May 20, 1966, 57 y.o.   MRN: [id number]  HPI: Gabrielle Mendoza is a 57 y.o. female  Chief Complaint  Patient presents with   Muscle Cramps    Patient states she has been experiencing muscle cramps in her calves and feet off and on for a couple of months. States she has increased her potassium in hopes this would help.    Irritability    Patient states she has noticed an increase in being irritable lately with the stress of daily life.    Pain    Patient states she has been having an increase in her back and knee pain. States these pains have been gradually getting worse over the last few months.    Hypothyroidism   HYPOTHYROIDISM Currently taking 88 MCG, this was adjusted in July due to elevated TSH. Thyroid control status:uncontrolled Satisfied with current treatment? yes Medication side effects: no Medication compliance: good compliance Etiology of hypothyroidism: parathyroid removed in past Recent dose adjustment:yes Fatigue: no Cold intolerance: no Heat intolerance: no Weight gain: a little Weight loss: no Constipation: at baseline -- takes various medications Diarrhea/loose stools: no Palpitations: no Lower extremity edema: no Anxiety/depressed mood:  yes more irritability    ARTHRALGIAS / JOINT ACHES Past couple months her feet and calves have been cramping during day at times and night (varies) + back pain ongoing at night, has to get up -- after 15 to 20 minutes after getting up feels. Duration: months Pain: yes Symmetric: yes  7/10 at night Quality: dull, aching, cramping, and throbbing Frequency: intermittent Context:  fluctuating Decreased  function/range of motion: no Erythema: no Swelling: no Heat or warmth: no Morning stiffness: yes Aggravating factors:  Alleviating factors:  Relief with NSAIDs?: No NSAIDs Taken Treatments attempted:  none  Involved Joints:     Hands: none    Wrists: none    Elbows: none    Shoulders: none    Back: yes     Hips: none    Knees: yes bilateral    Ankles: yes bilateral    Feet: yes bilateral   ANXIETY/STRESS Having increased irritability due to increased stressors - husband works out of town, gone every week and sometimes 2 weeks.  She has to assist her son who is having struggles with alcohol use + another son living in home with her (him and his wife), and work struggles.  In past we ordered Celexa, but she never took as felt she "got this". Duration:uncontrolled Anxious mood: yes  Excessive worrying: yes Irritability: yes  Sweating: no Nausea: no Palpitations:no Hyperventilation: no Panic attacks: no Agoraphobia: no  Obscessions/compulsions: no Depressed mood: occasional    04/27/2023    3:37 PM 10/14/2022    3:59 PM 02/10/2022    8:50 AM 09/17/2021    1:32 PM 02/05/2021    1:19 PM  Depression screen PHQ 2/9  Decreased Interest 0 0 2 0 0  Down, Depressed, Hopeless 0 0 0 0 0  PHQ - 2 Score 0 0 2 0 0  Altered sleeping 0 0 3 0   Tired, decreased  energy 1 0 3 0   Change in appetite 0 0 3 0   Feeling bad or failure about yourself  0 0 0 0   Trouble concentrating 0 0 0 0   Moving slowly or fidgety/restless 0 0 0 0   Suicidal thoughts 0 0 0 0   PHQ-9 Score 1 0 11 0   Difficult doing work/chores Not difficult at all Not difficult at all Not difficult at all    Anhedonia: no Weight changes: no Insomnia: yes hard to fall asleep  Hypersomnia: no Fatigue/loss of energy: no Feelings of worthlessness: no Feelings of guilt: no Impaired concentration/indecisiveness: no Suicidal ideations: no  Crying spells: no Recent Stressors/Life Changes: yes   Relationship problems:  no   Family stress: yes     Financial stress: no    Job stress: yes    Recent death/loss: no     05/08/2023    3:37 PM 10/14/2022    4:00 PM 02/10/2022    8:52 AM 09/17/2021    1:32 PM  GAD 7 : Generalized Anxiety Score  Nervous, Anxious, on Edge 0 0 3 0  Control/stop worrying 1 0 3 0  Worry too much - different things 1 0 3 0  Trouble relaxing 0 0 3 0  Restless 0 0 3 0  Easily annoyed or irritable 1 0 3 0  Afraid - awful might happen 0 0 3 0  Total GAD 7 Score 3 0 21 0  Anxiety Difficulty Not difficult at all Not difficult at all Not difficult at all      Relevant past medical, surgical, family and social history reviewed and updated as indicated. Interim medical history since our last visit reviewed. Allergies and medications reviewed and updated.  Review of Systems  Constitutional:  Negative for activity change, appetite change, diaphoresis, fatigue and fever.  Respiratory:  Negative for cough, chest tightness, shortness of breath and wheezing.   Cardiovascular:  Negative for chest pain, palpitations and leg swelling.  Gastrointestinal:  Positive for constipation (baseline).  Endocrine: Negative for cold intolerance and heat intolerance.  Musculoskeletal:  Positive for arthralgias and back pain.  Neurological: Negative.   Psychiatric/Behavioral:  Positive for sleep disturbance. Negative for decreased concentration, self-injury and suicidal ideas. The patient is nervous/anxious.     Per HPI unless specifically indicated above     Objective:    BP 134/82 (BP Location: Left Arm, Patient Position: Sitting, Cuff Size: Normal)   Pulse 68   Temp 97.9 F (36.6 C) (Oral)   Ht 5\' 2"  (1.575 m)   Wt 157 lb 3.2 oz (71.3 kg)   LMP 12/21/2017 Comment: irregular periods  SpO2 97%   BMI 28.75 kg/m   Wt Readings from Last 3 Encounters:  05/08/2023 157 lb 3.2 oz (71.3 kg)  10/14/22 152 lb 14.4 oz (69.4 kg)  07/27/22 161 lb 12.8 oz (73.4 kg)    Physical Exam Vitals and nursing  note reviewed.  Constitutional:      General: She is awake. She is not in acute distress.    Appearance: Normal appearance. She is well-developed and well-groomed. She is not ill-appearing or toxic-appearing.  HENT:     Head: Normocephalic.     Right Ear: Hearing and external ear normal.     Left Ear: Hearing and external ear normal.  Eyes:     General: Lids are normal.        Right eye: No discharge.        Left  eye: No discharge.     Conjunctiva/sclera: Conjunctivae normal.     Pupils: Pupils are equal, round, and reactive to light.  Neck:     Thyroid: No thyromegaly.     Vascular: No carotid bruit.  Cardiovascular:     Rate and Rhythm: Normal rate and regular rhythm.     Heart sounds: Normal heart sounds. No murmur heard.    No gallop.  Pulmonary:     Effort: Pulmonary effort is normal. No accessory muscle usage or respiratory distress.     Breath sounds: Normal breath sounds.  Abdominal:     General: Bowel sounds are normal. There is no distension.     Palpations: Abdomen is soft.     Tenderness: There is no abdominal tenderness.  Musculoskeletal:     Cervical back: Normal range of motion and neck supple.     Right lower leg: No edema.     Left lower leg: No edema.  Lymphadenopathy:     Cervical: No cervical adenopathy.  Skin:    General: Skin is warm and dry.  Neurological:     Mental Status: She is alert and oriented to person, place, and time.     Deep Tendon Reflexes: Reflexes are normal and symmetric.     Reflex Scores:      Brachioradialis reflexes are 2+ on the right side and 2+ on the left side.      Patellar reflexes are 2+ on the right side and 2+ on the left side. Psychiatric:        Attention and Perception: Attention normal.        Mood and Affect: Mood normal.        Speech: Speech normal.        Behavior: Behavior normal. Behavior is cooperative.        Thought Content: Thought content normal.     Results for orders placed or performed in visit on  01/13/23  TSH  Result Value Ref Range   TSH 5.000 (H) 0.450 - 4.500 uIU/mL  T4, free  Result Value Ref Range   Free T4 1.18 0.82 - 1.77 ng/dL      Assessment & Plan:   Problem List Items Addressed This Visit       Endocrine   Acquired hypothyroidism - Primary    Chronic, stable.  Labs monitored by Dr. Smith Robert with endo at [facility] in past, but okay for PCP to takeover.  Continue current medication regimen as prescribed and will adjust if needed.  Thyroid labs today.      Relevant Orders   TSH   T4, free     Other   Leg cramps    Refer to multiple joint pain plan of care.      Relevant Orders   Magnesium   Comprehensive metabolic panel   CBC with Differential/Platelet   Multiple joint pain    To lower back, both knees, and cramping to both lower legs.  ?if related to poor hydration and recent increased anxiety + recent thyroid labs not at goal -- she did not return for check as instructed.  Will check labs today: Thyroid levels, CBC, CMP, Mag.  Determine next steps after all labs returned.  Recommend she restart Magnesium 400 MG at night + ensure good hydration daily.  Can trial mustard or bar soap for cramping at night.  Recommend she take 1000 MG Tylenol before bed at this time to see if benefit.  Return in 5 weeks.  S/P parathyroidectomy    History of hyperparathyroidism, with parathyroid removed 12/04/2019.  She was followed by endo, Dr. Smith Robert with [facility], but okay for PCP to takeover and monitor thyroid labs in office.  Recent notes and labs reviewed.  Return to endo as needed.      Situational anxiety    Exacerbation of symptoms at this time.  Have discussed at length with her.  We will trial Celexa 10 MG at this time, which was ordered in past but she never took as improved.  Highly recommend she try this and see if helps overall mood and health.  Denies SI/HI.  Educated on SSRI family + BLACK BOX warning, is aware to let PCP know if any major side effects present.  Will have her  return in 5 weeks to see how mood is.  She is aware can take up to 6 weeks for full benefit.      Relevant Medications   citalopram (CELEXA) 10 MG tablet     Follow up plan: Return in about 5 weeks (around 06/01/2023) for ANXIETY AND LEG CRAMPS.

## 2023-04-27 NOTE — Assessment & Plan Note (Signed)
History of hyperparathyroidism, with parathyroid removed 12/04/2019.  She was followed by endo, Dr. Smith Robert with [facility], but okay for PCP to takeover and monitor thyroid labs in office.  Recent notes and labs reviewed.  Return to endo as needed.

## 2023-04-28 ENCOUNTER — Encounter: Payer: Self-pay | Admitting: Nurse Practitioner

## 2023-04-28 ENCOUNTER — Other Ambulatory Visit: Payer: Self-pay | Admitting: Nurse Practitioner

## 2023-04-28 DIAGNOSIS — E039 Hypothyroidism, unspecified: Secondary | ICD-10-CM

## 2023-04-28 LAB — TSH: TSH: 7.13 u[IU]/mL — ABNORMAL HIGH (ref 0.450–4.500)

## 2023-04-28 LAB — CBC WITH DIFFERENTIAL/PLATELET
Basophils Absolute: 0.1 10*3/uL (ref 0.0–0.2)
Basos: 2 %
EOS (ABSOLUTE): 0.5 10*3/uL — ABNORMAL HIGH (ref 0.0–0.4)
Eos: 7 %
Hematocrit: 38 % (ref 34.0–46.6)
Hemoglobin: 12.2 g/dL (ref 11.1–15.9)
Immature Grans (Abs): 0 10*3/uL (ref 0.0–0.1)
Immature Granulocytes: 0 %
Lymphocytes Absolute: 2.3 10*3/uL (ref 0.7–3.1)
Lymphs: 33 %
MCH: 27.7 pg (ref 26.6–33.0)
MCHC: 32.1 g/dL (ref 31.5–35.7)
MCV: 86 fL (ref 79–97)
Monocytes Absolute: 0.5 10*3/uL (ref 0.1–0.9)
Monocytes: 7 %
Neutrophils Absolute: 3.5 10*3/uL (ref 1.4–7.0)
Neutrophils: 51 %
Platelets: 320 10*3/uL (ref 150–450)
RBC: 4.4 x10E6/uL (ref 3.77–5.28)
RDW: 12.5 % (ref 11.7–15.4)
WBC: 6.9 10*3/uL (ref 3.4–10.8)

## 2023-04-28 LAB — COMPREHENSIVE METABOLIC PANEL
ALT: 21 [IU]/L (ref 0–32)
AST: 16 [IU]/L (ref 0–40)
Albumin: 4.2 g/dL (ref 3.8–4.9)
Alkaline Phosphatase: 103 [IU]/L (ref 44–121)
BUN/Creatinine Ratio: 14 (ref 9–23)
BUN: 10 mg/dL (ref 6–24)
Bilirubin Total: 0.2 mg/dL (ref 0.0–1.2)
CO2: 25 mmol/L (ref 20–29)
Calcium: 9.4 mg/dL (ref 8.7–10.2)
Chloride: 102 mmol/L (ref 96–106)
Creatinine, Ser: 0.71 mg/dL (ref 0.57–1.00)
Globulin, Total: 2.7 g/dL (ref 1.5–4.5)
Glucose: 93 mg/dL (ref 70–99)
Potassium: 4.1 mmol/L (ref 3.5–5.2)
Sodium: 140 mmol/L (ref 134–144)
Total Protein: 6.9 g/dL (ref 6.0–8.5)
eGFR: 100 mL/min/{1.73_m2} (ref >59)

## 2023-04-28 LAB — T4, FREE: Free T4: 1.27 ng/dL (ref 0.82–1.77)

## 2023-04-28 LAB — MAGNESIUM: Magnesium: 2 mg/dL (ref 1.6–2.3)

## 2023-04-28 MED ORDER — LEVOTHYROXINE SODIUM 112 MCG PO TABS: 112.0000 ug | ORAL_TABLET | Freq: Every day | ORAL | 3 refills | Status: DC

## 2023-04-28 NOTE — Progress Notes (Signed)
Contacted via MyChart -- needs lab visit in 6 weeks please   Good morning Gabrielle Mendoza, your labs have returned and all are fabulous with exception of TSH.  This went up and you are still symptomatic which is concerning.  So we need to go up on Levothyroxine to 112 MCG and stop 88 MCG dosing.  Continue to take first thing in morning 30 minutes prior to any other medication or food.  This explains some of your current symptoms.  I do want you to schedule a lab visit in 6 weeks, staff will call you.  Please ensure to come on in and get labs.  Any questions? Keep being amazing!!  Thank you for allowing me to participate in your care.  I appreciate you. Kindest regards, Rashi Giuliani

## 2023-05-03 NOTE — Progress Notes (Signed)
Pt stated she has an appointment on 06/01/2023 and would like to just receive her labs then.

## 2023-05-29 NOTE — Patient Instructions (Incomplete)
 Managing Anxiety, Adult  After being diagnosed with anxiety, you may be relieved to know why you have felt or behaved a certain way. You may also feel overwhelmed about the treatment ahead and what it will mean for your life. With care and support, you can manage your anxiety.  How to manage lifestyle changes  Understanding the difference between stress and anxiety  Although stress can play a role in anxiety, it is not the same as anxiety. Stress is your body's reaction to life changes and events, both good and bad. Stress is often caused by something external, such as a deadline, test, or competition. It normally goes away after the event has ended and will last just a few hours. But, stress can be ongoing and can lead to more than just stress.  Anxiety is caused by something internal, such as imagining a terrible outcome or worrying that something will go wrong that will greatly upset you. Anxiety often does not go away even after the event is over, and it can become a long-term (chronic) worry.  Lowering stress and anxiety    Talk with your health care provider or a counselor to learn more about lowering anxiety and stress. They may suggest tension-reduction techniques, such as:  Music. Spend time creating or listening to music that you enjoy and that inspires you.  Mindfulness-based meditation. Practice being aware of your normal breaths while not trying to control your breathing. It can be done while sitting or walking.  Centering prayer. Focus on a word, phrase, or sacred image that means something to you and brings you peace.  Deep breathing. Expand your stomach and inhale slowly through your nose. Hold your breath for 3-5 seconds. Then breathe out slowly, letting your stomach muscles relax.  Self-talk. Learn to notice and spot thought patterns that lead to anxiety reactions. Change those patterns to thoughts that feel peaceful.  Muscle relaxation. Take time to tense muscles and then relax them.  Choose a  tension-reduction technique that fits your lifestyle and personality. These techniques take time and practice. Set aside 5-15 minutes a day to do them. Specialized therapists can offer counseling and training in these techniques. The training to help with anxiety may be covered by some insurance plans.  Other things you can do to manage stress and anxiety include:  Keeping a stress diary. This can help you learn what triggers your reaction and then learn ways to manage your response.  Thinking about how you react to certain situations. You may not be able to control everything, but you can control your response.  Making time for activities that help you relax and not feeling guilty about spending your time in this way.  Doing visual imagery. This involves imagining or creating mental pictures to help you relax.  Practicing yoga. Through yoga poses, you can lower tension and relax.     Medicines  Medicines for anxiety include:  Antidepressant medicines. These are usually prescribed for long-term daily control.  Anti-anxiety medicines. These may be added in severe cases, especially when panic attacks occur.  When used together, medicines, psychotherapy, and tension-reduction techniques may be the most effective treatment.  Relationships  Relationships can play a big part in helping you recover. Spend more time connecting with trusted friends and family members. Think about going to couples counseling if you have a partner, taking family education classes, or going to family therapy. Therapy can help you and others better understand your anxiety.  How to recognize changes in  your anxiety  Everyone responds differently to treatment for anxiety. Recovery from anxiety happens when symptoms lessen and stop interfering with your daily life at home or work. This may mean that you will start to:  Have better concentration and focus. Worry will interfere less in your daily thinking.  Sleep better.  Be less irritable.  Have  more energy.  Have improved memory.  Try to recognize when your condition is getting worse. Contact your provider if your symptoms interfere with home or work and you feel like your condition is not improving.  Follow these instructions at home:  Activity  Exercise. Adults should:  Exercise for at least 150 minutes each week. The exercise should increase your heart rate and make you sweat (moderate-intensity exercise).  Do strengthening exercises at least twice a week.  Get the right amount and quality of sleep. Most adults need 7-9 hours of sleep each night.  Lifestyle    Eat a healthy diet that includes plenty of vegetables, fruits, whole grains, low-fat dairy products, and lean protein.  Do not eat a lot of foods that are high in fats, added sugars, or salt (sodium).  Make choices that simplify your life.  Do not use any products that contain nicotine or tobacco. These products include cigarettes, chewing tobacco, and vaping devices, such as e-cigarettes. If you need help quitting, ask your provider.  Avoid caffeine, alcohol, and certain over-the-counter cold medicines. These may make you feel worse. Ask your pharmacist which medicines to avoid.  General instructions  Take over-the-counter and prescription medicines only as told by your provider.  Keep all follow-up visits. This is to make sure you are managing your anxiety well or if you need more support.  Where to find support  You can get help and support from:  Self-help groups.  Online and [employer].  A trusted spiritual leader.  Couples counseling.  Family education classes.  Family therapy.  Where to find more information  You may find that joining a support group helps you deal with your anxiety. The following sources can help you find counselors or support groups near you:  Mental Health America: mentalhealthamerica.net  Anxiety and Depression Association of [place] (ADAA): adaa.org  [employer] Mental Illness (NAMI):  nami.org  Contact a health care provider if:  You have a hard time staying focused or finishing tasks.  You spend many hours a day feeling worried about everyday life.  You are very tired because you cannot stop worrying.  You start to have headaches or often feel tense.  You have chronic nausea or diarrhea.  Get help right away if:  Your heart feels like it is racing.  You have shortness of breath.  You have thoughts of hurting yourself or others.  Get help right away if you feel like you may hurt yourself or others, or have thoughts about taking your own life. Go to your nearest emergency room or:  Call 911.  Call the National Suicide Prevention Lifeline at 417-380-0019 or 988. This is open 24 hours a day.  Text the Crisis Text Line at 4151481703.  This information is not intended to replace advice given to you by your health care provider. Make sure you discuss any questions you have with your health care provider.  Document Revised: 03/24/2022 Document Reviewed: 10/06/2020  Elsevier Patient Education  2024 [employer].

## 2023-06-01 ENCOUNTER — Ambulatory Visit: Payer: Managed Care, Other (non HMO) | Admitting: Nurse Practitioner

## 2023-06-06 NOTE — Patient Instructions (Signed)
Be Involved in Caring For Your Health:  Taking Medications When medications are taken as directed, they can greatly improve your health. But if they are not taken as prescribed, they may not work. In some cases, not taking them correctly can be harmful. To help ensure your treatment remains effective and safe, understand your medications and how to take them. Bring your medications to each visit for review by your provider.  Your lab results, notes, and after visit summary will be available on My Chart. We strongly encourage you to use this feature. If lab results are abnormal the clinic will contact you with the appropriate steps. If the clinic does not contact you assume the results are satisfactory. You can always view your results on My Chart. If you have questions regarding your health or results, please contact the clinic during office hours. You can also ask questions on My Chart.  We at [facility] are grateful that you chose us to provide your care. We strive to provide evidence-based and compassionate care and are always looking for feedback. If you get a survey from the clinic please complete this so we can hear your opinions.  Managing Anxiety, Adult After being diagnosed with anxiety, you may be relieved to know why you have felt or behaved a certain way. You may also feel overwhelmed about the treatment ahead and what it will mean for your life. With care and support, you can manage your anxiety. How to manage lifestyle changes Understanding the difference between stress and anxiety Although stress can play a role in anxiety, it is not the same as anxiety. Stress is your body's reaction to life changes and events, both good and bad. Stress is often caused by something external, such as a deadline, test, or competition. It normally goes away after the event has ended and will last just a few hours. But, stress can be ongoing and can lead to more than just stress. Anxiety is  caused by something internal, such as imagining a terrible outcome or worrying that something will go wrong that will greatly upset you. Anxiety often does not go away even after the event is over, and it can become a long-term (chronic) worry. Lowering stress and anxiety Talk with your health care provider or a counselor to learn more about lowering anxiety and stress. They may suggest tension-reduction techniques, such as: Music. Spend time creating or listening to music that you enjoy and that inspires you. Mindfulness-based meditation. Practice being aware of your normal breaths while not trying to control your breathing. It can be done while sitting or walking. Centering prayer. Focus on a word, phrase, or sacred image that means something to you and brings you peace. Deep breathing. Expand your stomach and inhale slowly through your nose. Hold your breath for 3-5 seconds. Then breathe out slowly, letting your stomach muscles relax. Self-talk. Learn to notice and spot thought patterns that lead to anxiety reactions. Change those patterns to thoughts that feel peaceful. Muscle relaxation. Take time to tense muscles and then relax them. Choose a tension-reduction technique that fits your lifestyle and personality. These techniques take time and practice. Set aside 5-15 minutes a day to do them. Specialized therapists can offer counseling and training in these techniques. The training to help with anxiety may be covered by some insurance plans. Other things you can do to manage stress and anxiety include: Keeping a stress diary. This can help you learn what triggers your reaction and then learn ways   to manage your response. Thinking about how you react to certain situations. You may not be able to control everything, but you can control your response. Making time for activities that help you relax and not feeling guilty about spending your time in this way. Doing visual imagery. This involves  imagining or creating mental pictures to help you relax. Practicing yoga. Through yoga poses, you can lower tension and relax.  Medicines Medicines for anxiety include: Antidepressant medicines. These are usually prescribed for long-term daily control. Anti-anxiety medicines. These may be added in severe cases, especially when panic attacks occur. When used together, medicines, psychotherapy, and tension-reduction techniques may be the most effective treatment. Relationships Relationships can play a big part in helping you recover. Spend more time connecting with trusted friends and family members. Think about going to couples counseling if you have a partner, taking family education classes, or going to family therapy. Therapy can help you and others better understand your anxiety. How to recognize changes in your anxiety Everyone responds differently to treatment for anxiety. Recovery from anxiety happens when symptoms lessen and stop interfering with your daily life at home or work. This may mean that you will start to: Have better concentration and focus. Worry will interfere less in your daily thinking. Sleep better. Be less irritable. Have more energy. Have improved memory. Try to recognize when your condition is getting worse. Contact your provider if your symptoms interfere with home or work and you feel like your condition is not improving. Follow these instructions at home: Activity Exercise. Adults should: Exercise for at least 150 minutes each week. The exercise should increase your heart rate and make you sweat (moderate-intensity exercise). Do strengthening exercises at least twice a week. Get the right amount and quality of sleep. Most adults need 7-9 hours of sleep each night. Lifestyle  Eat a healthy diet that includes plenty of vegetables, fruits, whole grains, low-fat dairy products, and lean protein. Do not eat a lot of foods that are high in fats, added sugars, or salt  (sodium). Make choices that simplify your life. Do not use any products that contain nicotine or tobacco. These products include cigarettes, chewing tobacco, and vaping devices, such as e-cigarettes. If you need help quitting, ask your provider. Avoid caffeine, alcohol, and certain over-the-counter cold medicines. These may make you feel worse. Ask your pharmacist which medicines to avoid. General instructions Take over-the-counter and prescription medicines only as told by your provider. Keep all follow-up visits. This is to make sure you are managing your anxiety well or if you need more support. Where to find support You can get help and support from: Self-help groups. Online and community organizations. A trusted spiritual leader. Couples counseling. Family education classes. Family therapy. Where to find more information You may find that joining a support group helps you deal with your anxiety. The following sources can help you find counselors or support groups near you: Mental Health America: mentalhealthamerica.net Anxiety and Depression Association of America (ADAA): adaa.org National Alliance on Mental Illness (NAMI): nami.org Contact a health care provider if: You have a hard time staying focused or finishing tasks. You spend many hours a day feeling worried about everyday life. You are very tired because you cannot stop worrying. You start to have headaches or often feel tense. You have chronic nausea or diarrhea. Get help right away if: Your heart feels like it is racing. You have shortness of breath. You have thoughts of hurting yourself or others. Get help   right away if you feel like you may hurt yourself or others, or have thoughts about taking your own life. Go to your nearest emergency room or: Call 911. Call the National Suicide Prevention Lifeline at 1-800-273-8255 or 988. This is open 24 hours a day. Text the Crisis Text Line at 741741. This information is not  intended to replace advice given to you by your health care provider. Make sure you discuss any questions you have with your health care provider. Document Revised: 03/24/2022 Document Reviewed: 10/06/2020 Elsevier Patient Education  2024 Elsevier Inc.  

## 2023-06-09 ENCOUNTER — Ambulatory Visit: Payer: Managed Care, Other (non HMO) | Admitting: Nurse Practitioner

## 2023-06-09 ENCOUNTER — Encounter: Payer: Self-pay | Admitting: Nurse Practitioner

## 2023-06-09 VITALS — BP 119/69 | HR 69 | Temp 97.8°F | Ht 62.0 in | Wt 159.2 lb

## 2023-06-09 DIAGNOSIS — E039 Hypothyroidism, unspecified: Secondary | ICD-10-CM

## 2023-06-09 DIAGNOSIS — F418 Other specified anxiety disorders: Secondary | ICD-10-CM

## 2023-06-09 DIAGNOSIS — R252 Cramp and spasm: Secondary | ICD-10-CM | POA: Diagnosis not present

## 2023-06-09 MED ORDER — CITALOPRAM HYDROBROMIDE 10 MG PO TABS: 10.0000 mg | ORAL_TABLET | Freq: Every day | ORAL | 3 refills | Status: DC

## 2023-06-09 NOTE — Assessment & Plan Note (Signed)
Improved at this time with Celexa.  Continue Celexa 10 MG at this time as offering benefit.  Denies SI/HI.  Educated on SSRI family + BLACK BOX warning, is aware to let PCP know if any major side effects present.  Return as scheduled in April.

## 2023-06-09 NOTE — Assessment & Plan Note (Signed)
Improved at this time, suspect related to poor thyroid control.

## 2023-06-09 NOTE — Assessment & Plan Note (Signed)
Chronic, stable.  Labs monitored by Dr. Smith Robert with endo at [facility] in past, but now PCP monitors.  Continue current medication regimen as prescribed and will adjust if needed.  Thyroid labs today.

## 2023-06-09 NOTE — Progress Notes (Signed)
BP 119/69   Pulse 69   Temp 97.8 F (36.6 C) (Oral)   Ht 5\' 2"  (1.575 m)   Wt 159 lb 3.2 oz (72.2 kg)   LMP 12/21/2017 Comment: irregular periods  SpO2 98%   BMI 29.12 kg/m    Subjective:    Patient ID: Gabrielle Mendoza, female    DOB: 1966-05-23, 57 y.o.   MRN: [id number]  HPI: Gabrielle Mendoza is a 57 y.o. female  Chief Complaint  Patient presents with   Anxiety   Leg Cramps    Patient states that the cramps she was experiencing previously have gotten better    ANXIETY/STRESS Started on Celexa 04/27/23 due to family stressors.  She feels more calm with this.  She did complain of leg cramps last visit but these have improved.  Her thyroid labs were a bit elevated last visit and we adjusted her medication. Duration: improved Anxious mood: no  Excessive worrying: no Irritability: no  Sweating: no Nausea: no Palpitations:no Hyperventilation: no Panic attacks: no Agoraphobia: no  Obscessions/compulsions: no Depressed mood: no    Jun 26, 2023    3:06 PM 04/27/2023    3:37 PM 10/14/2022    3:59 PM 02/10/2022    8:50 AM 09/17/2021    1:32 PM  Depression screen PHQ 2/9  Decreased Interest 0 0 0 2 0  Down, Depressed, Hopeless 0 0 0 0 0  PHQ - 2 Score 0 0 0 2 0  Altered sleeping 0 0 0 3 0  Tired, decreased energy 0 1 0 3 0  Change in appetite 0 0 0 3 0  Feeling bad or failure about yourself  0 0 0 0 0  Trouble concentrating 0 0 0 0 0  Moving slowly or fidgety/restless 0 0 0 0 0  Suicidal thoughts 0 0 0 0 0  PHQ-9 Score 0 1 0 11 0  Difficult doing work/chores Not difficult at all Not difficult at all Not difficult at all Not difficult at all   Anhedonia: no Weight changes: no Insomnia: yes hard to stay asleep  Hypersomnia: no Fatigue/loss of energy: no Feelings of worthlessness: no Feelings of guilt: no Impaired concentration/indecisiveness: no Suicidal ideations: no  Crying spells: no Recent Stressors/Life Changes: yes   Relationship problems: no    Family stress: yes     Financial stress: no    Job stress: no    Recent death/loss: no     06-26-2023    3:06 PM 04/27/2023    3:37 PM 10/14/2022    4:00 PM 02/10/2022    8:52 AM  GAD 7 : Generalized Anxiety Score  Nervous, Anxious, on Edge 0 0 0 3  Control/stop worrying 0 1 0 3  Worry too much - different things 0 1 0 3  Trouble relaxing 0 0 0 3  Restless 0 0 0 3  Easily annoyed or irritable 0 1 0 3  Afraid - awful might happen 0 0 0 3  Total GAD 7 Score 0 3 0 21  Anxiety Difficulty Not difficult at all Not difficult at all Not difficult at all Not difficult at all   Relevant past medical, surgical, family and social history reviewed and updated as indicated. Interim medical history since our last visit reviewed. Allergies and medications reviewed and updated.  Review of Systems  Constitutional:  Negative for activity change, appetite change, diaphoresis, fatigue and fever.  Respiratory:  Negative for cough, chest tightness and shortness of breath.   Cardiovascular:  Negative for chest pain, palpitations and leg swelling.  Gastrointestinal: Negative.   Endocrine: Negative for cold intolerance and heat intolerance.  Neurological: Negative.   Psychiatric/Behavioral: Negative.     Per HPI unless specifically indicated above     Objective:    BP 119/69   Pulse 69   Temp 97.8 F (36.6 C) (Oral)   Ht 5\' 2"  (1.575 m)   Wt 159 lb 3.2 oz (72.2 kg)   LMP 12/21/2017 Comment: irregular periods  SpO2 98%   BMI 29.12 kg/m   Wt Readings from Last 3 Encounters:  06/09/23 159 lb 3.2 oz (72.2 kg)  04/27/23 157 lb 3.2 oz (71.3 kg)  10/14/22 152 lb 14.4 oz (69.4 kg)    Physical Exam Vitals and nursing note reviewed.  Constitutional:      General: She is awake. She is not in acute distress.    Appearance: Normal appearance. She is well-developed and well-groomed. She is not ill-appearing or toxic-appearing.  HENT:     Head: Normocephalic.     Right Ear: Hearing and external ear  normal.     Left Ear: Hearing and external ear normal.  Eyes:     General: Lids are normal.        Right eye: No discharge.        Left eye: No discharge.     Conjunctiva/sclera: Conjunctivae normal.     Pupils: Pupils are equal, round, and reactive to light.  Neck:     Thyroid: No thyromegaly.     Vascular: No carotid bruit.  Cardiovascular:     Rate and Rhythm: Normal rate and regular rhythm.     Heart sounds: Normal heart sounds. No murmur heard.    No gallop.  Pulmonary:     Effort: Pulmonary effort is normal. No accessory muscle usage or respiratory distress.     Breath sounds: Normal breath sounds.  Abdominal:     General: Bowel sounds are normal. There is no distension.     Palpations: Abdomen is soft.     Tenderness: There is no abdominal tenderness.  Musculoskeletal:     Cervical back: Normal range of motion and neck supple.     Right lower leg: No edema.     Left lower leg: No edema.  Lymphadenopathy:     Cervical: No cervical adenopathy.  Skin:    General: Skin is warm and dry.  Neurological:     Mental Status: She is alert and oriented to person, place, and time.     Deep Tendon Reflexes: Reflexes are normal and symmetric.     Reflex Scores:      Brachioradialis reflexes are 2+ on the right side and 2+ on the left side.      Patellar reflexes are 2+ on the right side and 2+ on the left side. Psychiatric:        Attention and Perception: Attention normal.        Mood and Affect: Mood normal.        Speech: Speech normal.        Behavior: Behavior normal. Behavior is cooperative.        Thought Content: Thought content normal.    Results for orders placed or performed in visit on 04/27/23  TSH  Result Value Ref Range   TSH 7.130 (H) 0.450 - 4.500 uIU/mL  Magnesium  Result Value Ref Range   Magnesium 2.0 1.6 - 2.3 mg/dL  Comprehensive metabolic panel  Result Value Ref Range  Glucose 93 70 - 99 mg/dL   BUN 10 6 - 24 mg/dL   Creatinine, Ser 1.61 0.57 -  1.00 mg/dL   eGFR 096 >04 VW/UJW/1.19   BUN/Creatinine Ratio 14 9 - 23   Sodium 140 134 - 144 mmol/L   Potassium 4.1 3.5 - 5.2 mmol/L   Chloride 102 96 - 106 mmol/L   CO2 25 20 - 29 mmol/L   Calcium 9.4 8.7 - 10.2 mg/dL   Total Protein 6.9 6.0 - 8.5 g/dL   Albumin 4.2 3.8 - 4.9 g/dL   Globulin, Total 2.7 1.5 - 4.5 g/dL   Bilirubin Total 0.2 0.0 - 1.2 mg/dL   Alkaline Phosphatase 103 44 - 121 IU/L   AST 16 0 - 40 IU/L   ALT 21 0 - 32 IU/L  CBC with Differential/Platelet  Result Value Ref Range   WBC 6.9 3.4 - 10.8 x10E3/uL   RBC 4.40 3.77 - 5.28 x10E6/uL   Hemoglobin 12.2 11.1 - 15.9 g/dL   Hematocrit 14.7 82.9 - 46.6 %   MCV 86 79 - 97 fL   MCH 27.7 26.6 - 33.0 pg   MCHC 32.1 31.5 - 35.7 g/dL   RDW 56.2 13.0 - 86.5 %   Platelets 320 150 - 450 x10E3/uL   Neutrophils 51 Not Estab. %   Lymphs 33 Not Estab. %   Monocytes 7 Not Estab. %   Eos 7 Not Estab. %   Basos 2 Not Estab. %   Neutrophils Absolute 3.5 1.4 - 7.0 x10E3/uL   Lymphocytes Absolute 2.3 0.7 - 3.1 x10E3/uL   Monocytes Absolute 0.5 0.1 - 0.9 x10E3/uL   EOS (ABSOLUTE) 0.5 (H) 0.0 - 0.4 x10E3/uL   Basophils Absolute 0.1 0.0 - 0.2 x10E3/uL   Immature Granulocytes 0 Not Estab. %   Immature Grans (Abs) 0.0 0.0 - 0.1 x10E3/uL  T4, free  Result Value Ref Range   Free T4 1.27 0.82 - 1.77 ng/dL      Assessment & Plan:   Problem List Items Addressed This Visit       Endocrine   Acquired hypothyroidism - Primary    Chronic, stable.  Labs monitored by Dr. Smith Robert with endo at [facility] in past, but now PCP monitors.  Continue current medication regimen as prescribed and will adjust if needed.  Thyroid labs today.      Relevant Orders   T4, free   TSH     Other   Leg cramps    Improved at this time, suspect related to poor thyroid control.      Situational anxiety    Improved at this time with Celexa.  Continue Celexa 10 MG at this time as offering benefit.  Denies SI/HI.  Educated on SSRI family + BLACK BOX warning,  is aware to let PCP know if any major side effects present.  Return as scheduled in April.      Relevant Medications   citalopram (CELEXA) 10 MG tablet     Follow up plan: Return for as scheduled in April for physical.

## 2023-06-10 LAB — TSH: TSH: 1.93 u[IU]/mL (ref 0.450–4.500)

## 2023-06-10 LAB — T4, FREE: Free T4: 1.44 ng/dL (ref 0.82–1.77)

## 2023-06-10 NOTE — Progress Notes (Signed)
Contacted via MyChart  Thyroid labs normal at this time, continue current dosing.  Any questions? Keep being stellar!!  Thank you for allowing me to participate in your care.  I appreciate you. Kindest regards, Sharna Gabrys

## 2023-10-06 ENCOUNTER — Other Ambulatory Visit: Payer: Self-pay | Admitting: Nurse Practitioner

## 2023-10-06 DIAGNOSIS — Z1231 Encounter for screening mammogram for malignant neoplasm of breast: Secondary | ICD-10-CM

## 2023-10-16 NOTE — Patient Instructions (Signed)
 Be Involved in Caring For Your Health:  Taking Medications When medications are taken as directed, they can greatly improve your health. But if they are not taken as prescribed, they may not work. In some cases, not taking them correctly can be harmful. To help ensure your treatment remains effective and safe, understand your medications and how to take them. Bring your medications to each visit for review by your provider.  Your lab results, notes, and after visit summary will be available on My Chart. We strongly encourage you to use this feature. If lab results are abnormal the clinic will contact you with the appropriate steps. If the clinic does not contact you assume the results are satisfactory. You can always view your results on My Chart. If you have questions regarding your health or results, please contact the clinic during office hours. You can also ask questions on My Chart.  We at [facility] are grateful that you chose [place] to provide your care. We strive to provide evidence-based and compassionate care and are always looking for feedback. If you get a survey from the clinic please complete this so we can hear your opinions.  Healthy Eating, Adult Healthy eating may help you get and keep a healthy body weight, reduce the risk of chronic disease, and live a long and productive life. It is important to follow a healthy eating pattern. Your nutritional and calorie needs should be met mainly by different nutrient-rich foods. What are tips for following this plan? Reading food labels Read labels and choose the following: Reduced or low sodium products. Juices with 100% fruit juice. Foods with low saturated fats (<3 g per serving) and high polyunsaturated and monounsaturated fats. Foods with whole grains, such as whole wheat, cracked wheat, brown rice, and wild rice. Whole grains that are fortified with folic acid. This is recommended for females who are pregnant or who want  to become pregnant. Read labels and do not eat or drink the following: Foods or drinks with added sugars. These include foods that contain brown sugar, corn sweetener, corn syrup, dextrose, fructose, glucose, high-fructose corn syrup, honey, invert sugar, lactose, malt syrup, maltose, molasses, raw sugar, sucrose, trehalose, or turbinado sugar. Limit your intake of added sugars to less than 10% of your total daily calories. Do not eat more than the following amounts of added sugar per day: 6 teaspoons (25 g) for females. 9 teaspoons (38 g) for males. Foods that contain processed or refined starches and grains. Refined grain products, such as white flour, degermed cornmeal, white bread, and white rice. Shopping Choose nutrient-rich snacks, such as vegetables, whole fruits, and nuts. Avoid high-calorie and high-sugar snacks, such as potato chips, fruit snacks, and candy. Use oil-based dressings and spreads on foods instead of solid fats such as butter, margarine, sour cream, or cream cheese. Limit pre-made sauces, mixes, and "instant" products such as flavored rice, instant noodles, and ready-made pasta. Try more plant-protein sources, such as tofu, tempeh, black beans, edamame, lentils, nuts, and seeds. Explore eating plans such as the Mediterranean diet or vegetarian diet. Try heart-healthy dips made with beans and healthy fats like hummus and guacamole. Vegetables go great with these. Cooking Use oil to saut or stir-fry foods instead of solid fats such as butter, margarine, or lard. Try baking, boiling, grilling, or broiling instead of frying. Remove the fatty part of meats before cooking. Steam vegetables in water or broth. Meal planning  At meals, imagine dividing your plate into fourths: One-half of  your plate is fruits and vegetables. One-fourth of your plate is whole grains. One-fourth of your plate is protein, especially lean meats, poultry, eggs, tofu, beans, or nuts. Include  low-fat dairy as part of your daily diet. Lifestyle Choose healthy options in all settings, including home, work, school, restaurants, or stores. Prepare your food safely: Wash your hands after handling raw meats. Where you prepare food, keep surfaces clean by regularly washing with hot, soapy water. Keep raw meats separate from ready-to-eat foods, such as fruits and vegetables. Cook seafood, meat, poultry, and eggs to the recommended temperature. Get a food thermometer. Store foods at safe temperatures. In general: Keep cold foods at 76F (4.4C) or below. Keep hot foods at 176F (60C) or above. Keep your freezer at [facility] (-17.8C) or below. Foods are not safe to eat if they have been between the temperatures of 40-176F (4.4-60C) for more than 2 hours. What foods should I eat? Fruits Aim to eat 1-2 cups of fresh, canned (in natural juice), or frozen fruits each day. One cup of fruit equals 1 small apple, 1 large banana, 8 large strawberries, 1 cup (237 g) canned fruit,  cup (82 g) dried fruit, or 1 cup (240 mL) 100% juice. Vegetables Aim to eat 2-4 cups of fresh and frozen vegetables each day, including different varieties and colors. One cup of vegetables equals 1 cup (91 g) broccoli or cauliflower florets, 2 medium carrots, 2 cups (150 g) raw, leafy greens, 1 large tomato, 1 large bell pepper, 1 large sweet potato, or 1 medium white potato. Grains Aim to eat 5-10 ounce-equivalents of whole grains each day. Examples of 1 ounce-equivalent of grains include 1 slice of bread, 1 cup (40 g) ready-to-eat cereal, 3 cups (24 g) popcorn, or  cup (93 g) cooked rice. Meats and other proteins Try to eat 5-7 ounce-equivalents of protein each day. Examples of 1 ounce-equivalent of protein include 1 egg,  oz nuts (12 almonds, 24 pistachios, or 7 walnut halves), 1/4 cup (90 g) cooked beans, 6 tablespoons (90 g) hummus or 1 tablespoon (16 g) peanut butter. A cut of meat or fish that is the size of a deck  of cards is about 3-4 ounce-equivalents (85 g). Of the protein you eat each week, try to have at least 8 sounce (227 g) of seafood. This is about 2 servings per week. This includes salmon, trout, herring, sardines, and anchovies. Dairy Aim to eat 3 cup-equivalents of fat-free or low-fat dairy each day. Examples of 1 cup-equivalent of dairy include 1 cup (240 mL) milk, 8 ounces (250 g) yogurt, 1 ounces (44 g) natural cheese, or 1 cup (240 mL) fortified soy milk. Fats and oils Aim for about 5 teaspoons (21 g) of fats and oils per day. Choose monounsaturated fats, such as canola and olive oils, mayonnaise made with olive oil or avocado oil, avocados, peanut butter, and most nuts, or polyunsaturated fats, such as sunflower, corn, and soybean oils, walnuts, pine nuts, sesame seeds, sunflower seeds, and flaxseed. Beverages Aim for 6 eight-ounce glasses of water per day. Limit coffee to 3-5 eight-ounce cups per day. Limit caffeinated beverages that have added calories, such as soda and energy drinks. If you drink alcohol: Limit how much you have to: 0-1 drink a day if you are female. 0-2 drinks a day if you are female. Know how much alcohol is in your drink. In the [place] one drink is one 12 oz bottle of beer (355 mL), one 5 oz glass of wine (  148 mL), or one 1 oz glass of hard liquor (44 mL). Seasoning and other foods Try not to add too much salt to your food. Try using herbs and spices instead of salt. Try not to add sugar to food. This information is based on [place] nutrition guidelines. To learn more, visit [employer]. Exact amounts may vary. You may need different amounts. This information is not intended to replace advice given to you by your health care provider. Make sure you discuss any questions you have with your health care provider. Document Revised: 03/16/2022 Document Reviewed: 03/16/2022 Elsevier Patient Education  2024 [employer].

## 2023-10-18 ENCOUNTER — Encounter: Payer: Self-pay | Admitting: Nurse Practitioner

## 2023-10-18 ENCOUNTER — Ambulatory Visit (INDEPENDENT_AMBULATORY_CARE_PROVIDER_SITE_OTHER): Payer: Self-pay | Admitting: Nurse Practitioner

## 2023-10-18 ENCOUNTER — Other Ambulatory Visit: Payer: Self-pay

## 2023-10-18 VITALS — BP 129/76 | HR 69 | Temp 98.7°F | Ht 62.5 in | Wt 167.8 lb

## 2023-10-18 DIAGNOSIS — I10 Essential (primary) hypertension: Secondary | ICD-10-CM

## 2023-10-18 DIAGNOSIS — Z0000 Encounter for general adult medical examination without abnormal findings: Secondary | ICD-10-CM | POA: Diagnosis not present

## 2023-10-18 DIAGNOSIS — Z136 Encounter for screening for cardiovascular disorders: Secondary | ICD-10-CM

## 2023-10-18 DIAGNOSIS — Z13220 Encounter for screening for lipoid disorders: Secondary | ICD-10-CM | POA: Diagnosis not present

## 2023-10-18 DIAGNOSIS — E039 Hypothyroidism, unspecified: Secondary | ICD-10-CM | POA: Diagnosis not present

## 2023-10-18 DIAGNOSIS — K582 Mixed irritable bowel syndrome: Secondary | ICD-10-CM

## 2023-10-18 DIAGNOSIS — F418 Other specified anxiety disorders: Secondary | ICD-10-CM | POA: Diagnosis not present

## 2023-10-18 DIAGNOSIS — M85832 Other specified disorders of bone density and structure, left forearm: Secondary | ICD-10-CM

## 2023-10-18 DIAGNOSIS — Z124 Encounter for screening for malignant neoplasm of cervix: Secondary | ICD-10-CM

## 2023-10-18 DIAGNOSIS — E538 Deficiency of other specified B group vitamins: Secondary | ICD-10-CM

## 2023-10-18 MED ORDER — LEVOTHYROXINE SODIUM 112 MCG PO TABS
112.0000 ug | ORAL_TABLET | Freq: Every day | ORAL | 1 refills | Status: DC
Start: 2023-10-18 — End: 2023-10-19

## 2023-10-18 MED ORDER — CITALOPRAM HYDROBROMIDE 10 MG PO TABS: 10.0000 mg | ORAL_TABLET | Freq: Every day | ORAL | 3 refills | Status: DC

## 2023-10-18 MED ORDER — LOSARTAN POTASSIUM 50 MG PO TABS: 50.0000 mg | ORAL_TABLET | Freq: Every day | ORAL | 4 refills | Status: AC

## 2023-10-18 NOTE — Assessment & Plan Note (Addendum)
 On DEXA 02/27/2019.  Recommend Calcium and Vit D supplementation daily + weight bearing exercises. Vitamin D  level today.

## 2023-10-18 NOTE — Assessment & Plan Note (Signed)
 Chronic, stable.  Labs monitored by Dr. Randy Buttery with endo at [facility] in past, but PCP took over years ago.  Continue current medication regimen as prescribed and will adjust if needed.  Thyroid labs today.

## 2023-10-18 NOTE — Progress Notes (Signed)
 BP 129/76 (BP Location: Left Arm, Cuff Size: Normal)   Pulse 69   Temp 98.7 F (37.1 C) (Oral)   Ht 5' 2.5" (1.588 m)   Wt 167 lb 12.8 oz (76.1 kg)   LMP 12/21/2017 Comment: irregular periods  SpO2 97%   BMI 30.20 kg/m    Subjective:    Patient ID: Gabrielle Mendoza, female    DOB: [birth year], 58 y.o.   MRN: [id number]  HPI: Gabrielle Mendoza is a 58 y.o. female presenting on 10/18/2023 for comprehensive medical examination. Current medical complaints include:none  She currently lives with: husband Menopausal Symptoms: no  HYPERTENSION without Chronic Kidney Disease Continues on Losartan . Continues B12 supplement. Hypertension status: stable  Satisfied with current treatment? yes Duration of hypertension: chronic BP monitoring frequency:  not checking BP range:  BP medication side effects:  no Medication compliance: good compliance Aspirin: no Recurrent headaches: no Visual changes: no Palpitations: no Dyspnea: no Chest pain: no Lower extremity edema: no Dizzy/lightheaded: no  The 10-year ASCVD risk score (Arnett DK, et al., 2019) is: 2.3%   Values used to calculate the score:     Age: 53 years     Sex: Female     Is Non-Hispanic African American: No     Diabetic: No     Tobacco smoker: No     Systolic Blood Pressure: 129 mmHg     Is BP treated: Yes     HDL Cholesterol: 48 mg/dL     Total Cholesterol: 138 mg/dL  HYPOTHYROIDISM Continues on Levothyroxine  112 MCG daily. History of osteopenia Thyroid control status:stable Satisfied with current treatment? yes Medication side effects: no Medication compliance: good compliance Etiology of hypothyroidism: acquired Recent dose adjustment:no Fatigue: yes Cold intolerance: no Heat intolerance: no Weight gain: yes a little  Weight loss: no Constipation: yes Diarrhea/loose stools: no Palpitations: no Lower extremity edema: no Anxiety/depressed mood: no   DEPRESSION Taking Celexa  10 MG daily.  Has more  stressors recently and wants to know if she can 2 tablets at times. Mood status: stable Satisfied with current treatment?: yes Symptom severity: mild  Duration of current treatment : chronic Side effects: no Medication compliance: good compliance Psychotherapy/counseling: none Depressed mood: no Anxious mood:  occasionally Anhedonia: no Significant weight loss or gain: no Insomnia: occasional Fatigue: yes Feelings of worthlessness or guilt: no Impaired concentration/indecisiveness: no Suicidal ideations: no Hopelessness: no Crying spells: no    10/18/2023    3:50 PM 06/09/2023    3:06 PM 04/27/2023    3:37 PM 10/14/2022    3:59 PM 02/10/2022    8:50 AM  Depression screen PHQ 2/9  Decreased Interest 0 0 0 0 2  Down, Depressed, Hopeless 0 0 0 0 0  PHQ - 2 Score 0 0 0 0 2  Altered sleeping 1 0 0 0 3  Tired, decreased energy 1 0 1 0 3  Change in appetite 0 0 0 0 3  Feeling bad or failure about yourself  0 0 0 0 0  Trouble concentrating 0 0 0 0 0  Moving slowly or fidgety/restless 0 0 0 0 0  Suicidal thoughts 0 0 0 0 0  PHQ-9 Score 2 0 1 0 11  Difficult doing work/chores Not difficult at all Not difficult at all Not difficult at all Not difficult at all Not difficult at all      10/18/2023    3:50 PM 06/09/2023    3:06 PM 04/27/2023    3:37 PM 10/14/2022  4:00 PM  GAD 7 : Generalized Anxiety Score  Nervous, Anxious, on Edge 1 0 0 0  Control/stop worrying 1 0 1 0  Worry too much - different things 1 0 1 0  Trouble relaxing 0 0 0 0  Restless 0 0 0 0  Easily annoyed or irritable 1 0 1 0  Afraid - awful might happen 0 0 0 0  Total GAD 7 Score 4 0 3 0  Anxiety Difficulty Not difficult at all Not difficult at all Not difficult at all Not difficult at all      04/07/2022    8:18 AM 10/14/2022    3:55 PM 04/27/2023    3:37 PM 06/09/2023    3:05 PM 10/18/2023    3:49 PM  Fall Risk  Falls in the past year?  0 0 0 0  Was there an injury with Fall?  0 0 0 0  Fall Risk  Category Calculator  0 0 0 0  (RETIRED) Patient Fall Risk Level Low fall risk      Patient at Risk for Falls Due to  No Fall Risks No Fall Risks No Fall Risks No Fall Risks  Fall risk Follow up  Education provided Falls evaluation completed Falls evaluation completed Falls evaluation completed    Past Medical History:  Past Medical History:  Diagnosis Date   Anemia    Asthma    Hypertension    Hypothyroidism    Stress incontinence    Thyroid disease     Surgical History:  Past Surgical History:  Procedure Laterality Date   CARDIAC CATHETERIZATION     CHONDROPLASTY Right 07/31/2019   Procedure: CHONDROPLASTY;  Surgeon: Arlyne Lame, MD;  Location: [facility] OR[facility];  Service: Orthopedics;  Laterality: Right;   KNEE ARTHROSCOPY WITH MEDIAL MENISECTOMY Right 07/31/2019   Procedure: KNEE ARTHROSCOPY WITH MEDIAL MENISECTOMY;  Surgeon: Arlyne Lame, MD;  Location: [facility];  Service: Orthopedics;  Laterality: Right;    Medications:  Current Outpatient Medications on File Prior to Visit  Medication Sig   albuterol (VENTOLIN HFA) 108 (90 Base) MCG/ACT inhaler Inhale 2 puffs into the lungs every 6 (six) hours as needed for wheezing or shortness of breath.   ibuprofen  (ADVIL ) 200 MG tablet Take 400 mg by mouth every 6 (six) hours as needed for moderate pain.   No current facility-administered medications on file prior to visit.    Allergies:  Allergies  Allergen Reactions   Prednisone  Other (See Comments)    Mood irritability with this -- would like to discuss before this is prescribed.   Tramadol Nausea Only    Social History:  Social History   Socioeconomic History   Marital status: Married    Spouse name: Not on file   Number of children: 3   Years of education: Not on file   Highest education level: Not on file  Occupational History   Not on file  Tobacco Use   Smoking status: Former    Current packs/day: 0.00    Types: Cigarettes    Quit date: 07/27/2009    Years since  quitting: 14.2   Smokeless tobacco: Never  Vaping Use   Vaping status: Former   Quit date: 07/27/2014  Substance and Sexual Activity   Alcohol use: No   Drug use: Never   Sexual activity: Yes  Other Topics Concern   Not on file  Social History Narrative   Not on file   Social Drivers of Health   Financial  Resource Strain: Low Risk  (10/18/2023)   Overall Financial Resource Strain (CARDIA)    Difficulty of Paying Living Expenses: Not hard at all  Food Insecurity: No Food Insecurity (10/18/2023)   Hunger Vital Sign    Worried About Running Out of Food in the Last Year: Never true    Ran Out of Food in the Last Year: Never true  Transportation Needs: No Transportation Needs (10/18/2023)   PRAPARE - Administrator, Civil Service (Medical): No    Lack of Transportation (Non-Medical): No  Physical Activity: Sufficiently Active (10/18/2023)   Exercise Vital Sign    Days of Exercise per Week: 7 days    Minutes of Exercise per Session: 30 min  Stress: No Stress Concern Present (10/18/2023)   [employer] of Occupational Health - Occupational Stress Questionnaire    Feeling of Stress : Not at all  Social Connections: Moderately Isolated (10/18/2023)   Social Connection and Isolation Panel [NHANES]    Frequency of Communication with Friends and Family: More than three times a week    Frequency of Social Gatherings with Friends and Family: Once a week    Attends Religious Services: Never    Database administrator or Organizations: No    Attends Banker Meetings: Never    Marital Status: Married  Catering manager Violence: Not At Risk (10/18/2023)   Humiliation, Afraid, Rape, and Kick questionnaire    Fear of Current or Ex-Partner: No    Emotionally Abused: No    Physically Abused: No    Sexually Abused: No   Social History   Tobacco Use  Smoking Status Former   Current packs/day: 0.00   Types: Cigarettes   Quit date: 07/27/2009   Years since quitting:  14.2  Smokeless Tobacco Never   Social History   Substance and Sexual Activity  Alcohol Use No    Family History:  Family History  Problem Relation Age of Onset   Diabetes Mother    Hypertension Mother    Heart disease Mother    Bone cancer Mother    Hyperthyroidism Sister    Heart attack Sister    Breast cancer Neg Hx     Past medical history, surgical history, medications, allergies, family history and social history reviewed with patient today and changes made to appropriate areas of the chart.   ROS All other ROS negative except what is listed above and in the HPI.      Objective:    BP 129/76 (BP Location: Left Arm, Cuff Size: Normal)   Pulse 69   Temp 98.7 F (37.1 C) (Oral)   Ht 5' 2.5" (1.588 m)   Wt 167 lb 12.8 oz (76.1 kg)   LMP 12/21/2017 Comment: irregular periods  SpO2 97%   BMI 30.20 kg/m   Wt Readings from Last 3 Encounters:  10/18/23 167 lb 12.8 oz (76.1 kg)  06/09/23 159 lb 3.2 oz (72.2 kg)  04/27/23 157 lb 3.2 oz (71.3 kg)    Physical Exam Vitals and nursing note reviewed. Exam conducted with a chaperone present.  Constitutional:      General: She is awake. She is not in acute distress.    Appearance: She is well-developed and well-groomed. She is not ill-appearing or toxic-appearing.  HENT:     Head: Normocephalic and atraumatic.     Right Ear: Hearing, tympanic membrane, ear canal and external ear normal. No drainage.     Left Ear: Hearing, tympanic membrane, ear canal and  external ear normal. No drainage.     Nose: Nose normal.     Right Sinus: No maxillary sinus tenderness or frontal sinus tenderness.     Left Sinus: No maxillary sinus tenderness or frontal sinus tenderness.     Mouth/Throat:     Mouth: Mucous membranes are moist.     Pharynx: Oropharynx is clear. Uvula midline. No pharyngeal swelling, oropharyngeal exudate or posterior oropharyngeal erythema.  Eyes:     General: Lids are normal.        Right eye: No discharge.         Left eye: No discharge.     Extraocular Movements: Extraocular movements intact.     Conjunctiva/sclera: Conjunctivae normal.     Pupils: Pupils are equal, round, and reactive to light.     Visual Fields: Right eye visual fields normal and left eye visual fields normal.  Neck:     Thyroid: No thyromegaly.     Vascular: No carotid bruit.     Trachea: Trachea normal.  Cardiovascular:     Rate and Rhythm: Normal rate and regular rhythm.     Heart sounds: Normal heart sounds. No murmur heard.    No gallop.  Pulmonary:     Effort: Pulmonary effort is normal. No accessory muscle usage or respiratory distress.     Breath sounds: Normal breath sounds.  Chest:  Breasts:    Right: Normal.     Left: Normal.  Abdominal:     General: Bowel sounds are normal.     Palpations: Abdomen is soft. There is no hepatomegaly or splenomegaly.     Tenderness: There is no abdominal tenderness.  Musculoskeletal:        General: Normal range of motion.     Cervical back: Normal range of motion and neck supple.     Right lower leg: No edema.     Left lower leg: No edema.  Lymphadenopathy:     Head:     Right side of head: No submental, submandibular, tonsillar, preauricular or posterior auricular adenopathy.     Left side of head: No submental, submandibular, tonsillar, preauricular or posterior auricular adenopathy.     Cervical: No cervical adenopathy.     Upper Body:     Right upper body: No supraclavicular, axillary or pectoral adenopathy.     Left upper body: No supraclavicular, axillary or pectoral adenopathy.  Skin:    General: Skin is warm and dry.     Capillary Refill: Capillary refill takes less than 2 seconds.     Findings: No rash.  Neurological:     Mental Status: She is alert and oriented to person, place, and time.     Gait: Gait is intact.     Deep Tendon Reflexes: Reflexes are normal and symmetric.     Reflex Scores:      Brachioradialis reflexes are 2+ on the right side and 2+ on  the left side.      Patellar reflexes are 2+ on the right side and 2+ on the left side. Psychiatric:        Attention and Perception: Attention normal.        Mood and Affect: Mood normal.        Speech: Speech normal.        Behavior: Behavior normal. Behavior is cooperative.        Thought Content: Thought content normal.        Judgment: Judgment normal.    Results for orders  placed or performed in visit on 06/09/23  T4, free   Collection Time: 06/09/23  3:33 PM  Result Value Ref Range   Free T4 1.44 0.82 - 1.77 ng/dL  TSH   Collection Time: 06/09/23  3:33 PM  Result Value Ref Range   TSH 1.930 0.450 - 4.500 uIU/mL      Assessment & Plan:   Problem List Items Addressed This Visit       Cardiovascular and Mediastinum   Essential hypertension - Primary   Chronic, stable.  BP at goal in office and on home readings.  Recommend she monitor BP at least a few mornings a week at home and document.  DASH diet at home.  Continue current medication regimen and adjust as needed.  Labs: CBC, CMP, TSH, urine ALB.  Refills sent in.       Relevant Medications   losartan  (COZAAR ) 50 MG tablet   Other Relevant Orders   Microalbumin, Urine Waived   Comprehensive metabolic panel with GFR   CBC with Differential/Platelet     Endocrine   Acquired hypothyroidism   Chronic, stable.  Labs monitored by Dr. Randy Buttery with endo at [facility] in past, but PCP took over years ago.  Continue current medication regimen as prescribed and will adjust if needed.  Thyroid labs today.      Relevant Orders   T4, free   TSH     Musculoskeletal and Integument   Osteopenia of left forearm   On DEXA 02/27/2019.  Recommend Calcium and Vit D supplementation daily + weight bearing exercises. Vitamin D  level today.      Relevant Orders   VITAMIN D  25 Hydroxy (Vit-D Deficiency, Fractures)     Other   Situational anxiety   Chronic, ongoing.  Continue Celexa  10 MG at this time as offering benefit.  Denies SI/HI.   Educated on SSRI family + BLACK BOX warning, is aware to let PCP know if any major side effects present.  If trials increase to 20 MG she is to alert PCP.      Relevant Medications   citalopram  (CELEXA ) 10 MG tablet   B12 deficiency   Ongoing, no current supplement.  Recheck level today and restart supplement as needed.      Relevant Orders   CBC with Differential/Platelet   Vitamin B12   Other Visit Diagnoses       Encounter for lipid screening for cardiovascular disease       Lipid panel today.   Relevant Orders   Lipid Panel w/o Chol/HDL Ratio     Encounter for annual physical exam       Annual physical today with labs and health maintenance reviewed, discussed with patient.        Follow up plan: Return in about 6 months (around 04/18/2024) for Hypothyroid and HTN/HLD.   LABORATORY TESTING:  - Pap smear: up to date -- due 2026  IMMUNIZATIONS:   - Tdap: Tetanus vaccination status reviewed: last tetanus booster within 10 years. - Influenza: Up to date - Pneumovax: Not applicable - Prevnar: Not applicable - COVID: Up To Date x 2 - HPV: Not applicable - Shingrix vaccine: Refused  SCREENING: -Mammogram: Ordered today  - Colonoscopy: maybe in the fall will have done  - Bone Density: Not applicable  -Hearing Test: Not applicable  -Spirometry: Not applicable   PATIENT COUNSELING:   Advised to take 1 mg of folate supplement per day if capable of pregnancy.   Sexuality: Discussed sexually transmitted diseases,  partner selection, use of condoms, avoidance of unintended pregnancy  and contraceptive alternatives.   Advised to avoid cigarette smoking.  I discussed with the patient that most people either abstain from alcohol or drink within safe limits (<=14/week and <=4 drinks/occasion for males, <=7/weeks and <= 3 drinks/occasion for females) and that the risk for alcohol disorders and other health effects rises proportionally with the number of drinks per week and how  often a drinker exceeds daily limits.  Discussed cessation/primary prevention of drug use and availability of treatment for abuse.   Diet: Encouraged to adjust caloric intake to maintain  or achieve ideal body weight, to reduce intake of dietary saturated fat and total fat, to limit sodium intake by avoiding high sodium foods and not adding table salt, and to maintain adequate dietary potassium and calcium preferably from fresh fruits, vegetables, and low-fat dairy products.    Stressed the importance of regular exercise  Injury prevention: Discussed safety belts, safety helmets, smoke detector, smoking near bedding or upholstery.   Dental health: Discussed importance of regular tooth brushing, flossing, and dental visits.    NEXT PREVENTATIVE PHYSICAL DUE IN 1 YEAR. Return in about 6 months (around 04/18/2024) for Hypothyroid and HTN/HLD.

## 2023-10-18 NOTE — Assessment & Plan Note (Signed)
 Chronic, stable.  BP at goal in office and on home readings.  Recommend she monitor BP at least a few mornings a week at home and document.  DASH diet at home.  Continue current medication regimen and adjust as needed.  Labs: CBC, CMP, TSH, urine ALB.  Refills sent in.

## 2023-10-18 NOTE — Assessment & Plan Note (Addendum)
 Chronic, ongoing.  Continue Celexa  10 MG at this time as offering benefit.  Denies SI/HI.  Educated on SSRI family + BLACK BOX warning, is aware to let PCP know if any major side effects present.  If trials increase to 20 MG she is to alert PCP.

## 2023-10-18 NOTE — Assessment & Plan Note (Signed)
Ongoing, no current supplement.  Recheck level today and restart supplement as needed.

## 2023-10-19 ENCOUNTER — Encounter: Payer: Self-pay | Admitting: Nurse Practitioner

## 2023-10-19 ENCOUNTER — Other Ambulatory Visit: Payer: Self-pay | Admitting: Nurse Practitioner

## 2023-10-19 DIAGNOSIS — E039 Hypothyroidism, unspecified: Secondary | ICD-10-CM

## 2023-10-19 LAB — CBC WITH DIFFERENTIAL/PLATELET
Basophils Absolute: 0.1 10*3/uL (ref 0.0–0.2)
Basos: 2 %
EOS (ABSOLUTE): 0.4 10*3/uL (ref 0.0–0.4)
Eos: 6 %
Hematocrit: 38.5 % (ref 34.0–46.6)
Hemoglobin: 12.6 g/dL (ref 11.1–15.9)
Immature Grans (Abs): 0 10*3/uL (ref 0.0–0.1)
Immature Granulocytes: 0 %
Lymphocytes Absolute: 2.2 10*3/uL (ref 0.7–3.1)
Lymphs: 29 %
MCH: 28.6 pg (ref 26.6–33.0)
MCHC: 32.7 g/dL (ref 31.5–35.7)
MCV: 88 fL (ref 79–97)
Monocytes Absolute: 0.5 10*3/uL (ref 0.1–0.9)
Monocytes: 7 %
Neutrophils Absolute: 4.4 10*3/uL (ref 1.4–7.0)
Neutrophils: 56 %
Platelets: 348 10*3/uL (ref 150–450)
RBC: 4.4 x10E6/uL (ref 3.77–5.28)
RDW: 12.8 % (ref 11.7–15.4)
WBC: 7.8 10*3/uL (ref 3.4–10.8)

## 2023-10-19 LAB — COMPREHENSIVE METABOLIC PANEL WITH GFR
ALT: 13 IU/L (ref 0–32)
AST: 11 IU/L (ref 0–40)
Albumin: 4.5 g/dL (ref 3.8–4.9)
Alkaline Phosphatase: 96 IU/L (ref 44–121)
BUN/Creatinine Ratio: 17 (ref 9–23)
BUN: 16 mg/dL (ref 6–24)
Bilirubin Total: 0.2 mg/dL (ref 0.0–1.2)
CO2: 24 mmol/L (ref 20–29)
Calcium: 10 mg/dL (ref 8.7–10.2)
Chloride: 103 mmol/L (ref 96–106)
Creatinine, Ser: 0.94 mg/dL (ref 0.57–1.00)
Globulin, Total: 2.6 g/dL (ref 1.5–4.5)
Glucose: 93 mg/dL (ref 70–99)
Potassium: 4 mmol/L (ref 3.5–5.2)
Sodium: 140 mmol/L (ref 134–144)
Total Protein: 7.1 g/dL (ref 6.0–8.5)
eGFR: 71 mL/min/{1.73_m2} (ref >59)

## 2023-10-19 LAB — VITAMIN B12: Vitamin B-12: 1041 pg/mL (ref 232–1245)

## 2023-10-19 LAB — LIPID PANEL W/O CHOL/HDL RATIO
Cholesterol, Total: 193 mg/dL (ref 100–199)
HDL: 62 mg/dL (ref >39)
LDL Chol Calc (NIH): 107 mg/dL — ABNORMAL HIGH (ref 0–99)
Triglycerides: 140 mg/dL (ref 0–149)
VLDL Cholesterol Cal: 24 mg/dL (ref 5–40)

## 2023-10-19 LAB — MICROALBUMIN, URINE WAIVED
Creatinine, Urine Waived: 200 mg/dL (ref 10–300)
Microalb, Ur Waived: 30 mg/L — ABNORMAL HIGH (ref 0–19)
Microalb/Creat Ratio: <30 mg/g (ref <30)

## 2023-10-19 LAB — TSH: TSH: 46.3 u[IU]/mL — ABNORMAL HIGH (ref 0.450–4.500)

## 2023-10-19 LAB — T4, FREE: Free T4: 0.67 ng/dL — ABNORMAL LOW (ref 0.82–1.77)

## 2023-10-19 LAB — VITAMIN D 25 HYDROXY (VIT D DEFICIENCY, FRACTURES): Vit D, 25-Hydroxy: 33 ng/mL (ref 30.0–100.0)

## 2023-10-19 MED ORDER — LEVOTHYROXINE SODIUM 137 MCG PO TABS: 137.0000 ug | ORAL_TABLET | Freq: Every day | ORAL | 1 refills | Status: DC

## 2023-10-19 NOTE — Progress Notes (Signed)
 Contacted via MyChart -- lab only visit in 6 weeks please:) Also please call patient too and ensure she received message with changes as does not always check:)  Good afternoon Kissy, your labs have returned and my biggest concern is your thyroid levels. TSH is quite elevated at 46.300 and Free T4 low at 0.67. This means you are quite hypothyroid, sluggish thyroid levels.  Are you taking your Levothyroxine  every morning, 30 minutes before eating or taking other medications?  Please let me know.  We need to increase your Levothyroxine  dose to 137 MCG daily and then recheck levels outpatient in 6 weeks, please ensure you attend a lab visit. - Lipid panel continues to show mild elevation in LDL, continue focus on diet and regular activity. - Remainder of labs are stable.  Any questions? Keep being stellar!!  Thank you for allowing me to participate in your care.  I appreciate you. Kindest regards, Nandan Willems

## 2023-10-20 ENCOUNTER — Ambulatory Visit
Admission: RE | Admit: 2023-10-20 | Discharge: 2023-10-20 | Disposition: A | Discharge status: Home / Self Care | Source: Ambulatory Visit | Attending: Nurse Practitioner | Admitting: Nurse Practitioner

## 2023-10-20 DIAGNOSIS — Z1231 Encounter for screening mammogram for malignant neoplasm of breast: Secondary | ICD-10-CM | POA: Insufficient documentation

## 2023-10-20 NOTE — Progress Notes (Signed)
 Lab appt scheduled.

## 2023-10-21 ENCOUNTER — Encounter: Payer: Self-pay | Admitting: Nurse Practitioner

## 2023-11-01 ENCOUNTER — Encounter: Payer: Self-pay | Admitting: Nurse Practitioner

## 2023-11-08 ENCOUNTER — Encounter: Payer: Self-pay | Admitting: Nurse Practitioner

## 2023-11-23 ENCOUNTER — Encounter: Payer: Self-pay | Admitting: Nurse Practitioner

## 2023-12-01 ENCOUNTER — Other Ambulatory Visit

## 2023-12-02 ENCOUNTER — Other Ambulatory Visit

## 2023-12-02 DIAGNOSIS — E039 Hypothyroidism, unspecified: Secondary | ICD-10-CM

## 2023-12-04 LAB — T4, FREE: Free T4: 1.72 ng/dL (ref 0.82–1.77)

## 2023-12-04 LAB — TSH: TSH: 0.816 u[IU]/mL (ref 0.450–4.500)

## 2023-12-05 ENCOUNTER — Other Ambulatory Visit: Payer: Self-pay | Admitting: Nurse Practitioner

## 2023-12-05 ENCOUNTER — Ambulatory Visit: Payer: Self-pay | Admitting: Nurse Practitioner

## 2023-12-05 MED ORDER — LEVOTHYROXINE SODIUM 137 MCG PO TABS
137.0000 ug | ORAL_TABLET | Freq: Every day | ORAL | 2 refills | Status: AC
Start: 2023-12-05 — End: ?

## 2023-12-05 NOTE — Progress Notes (Signed)
 Contacted via MyChart   Good morning Gabrielle Mendoza, your labs have returned and thyroid levels are normal.  Continue current Levothyroxine  dosing.  Any questions? Keep being amazing!!  Thank you for allowing me to participate in your care.  I appreciate you. Kindest regards, Averyanna Sax

## 2023-12-29 ENCOUNTER — Encounter: Payer: Self-pay | Admitting: Nurse Practitioner

## 2024-01-17 ENCOUNTER — Encounter: Payer: Self-pay | Admitting: Nurse Practitioner

## 2024-02-02 ENCOUNTER — Encounter: Payer: Self-pay | Admitting: Nurse Practitioner

## 2024-02-14 ENCOUNTER — Encounter: Payer: Self-pay | Admitting: Nurse Practitioner

## 2024-02-18 ENCOUNTER — Encounter: Payer: Self-pay | Admitting: Nurse Practitioner

## 2024-05-07 NOTE — Patient Instructions (Signed)
 Fatigue If you have fatigue, you feel tired all the time and have a lack of energy or a lack of motivation. Fatigue may make it difficult to start or complete tasks because of exhaustion. Occasional or mild fatigue is often a normal response to activity or life. However, long-term (chronic) or extreme fatigue may be a symptom of a medical condition such as: Depression. Not having enough red blood cells or hemoglobin in the blood (anemia). A problem with a small gland located in the lower front part of the neck (thyroid disorder). Rheumatologic conditions. These are problems related to the body's defense system (immune system). Infections, especially certain viral infections. Fatigue can also lead to negative health outcomes over time. Follow these instructions at home: Medicines Take over-the-counter and prescription medicines only as told by your health care provider. Take a multivitamin if told by your health care provider. Do not use herbal or dietary supplements unless they are approved by your health care provider. Eating and drinking  Avoid heavy meals in the evening. Eat a well-balanced diet, which includes lean proteins, whole grains, plenty of fruits and vegetables, and low-fat dairy products. Avoid eating or drinking too many products with caffeine in them. Avoid alcohol. Drink enough fluid to keep your urine pale yellow. Activity  Exercise regularly, as told by your health care provider. Use or practice techniques to help you relax, such as yoga, tai chi, meditation, or massage therapy. Lifestyle Change situations that cause you stress. Try to keep your work and personal schedules in balance. Do not use recreational or illegal drugs. General instructions Monitor your fatigue for any changes. Go to bed and get up at the same time every day. Avoid fatigue by pacing yourself during the day and getting enough sleep at night. Maintain a healthy weight. Contact a health care  provider if: Your fatigue does not get better. You have a fever. You suddenly lose or gain weight. You have headaches. You have trouble falling asleep or sleeping through the night. You feel angry, guilty, anxious, or sad. You have swelling in your legs or another part of your body. Get help right away if: You feel confused, feel like you might faint, or faint. Your vision is blurry or you have a severe headache. You have severe pain in your abdomen, your back, or the area between your waist and hips (pelvis). You have chest pain, shortness of breath, or an irregular or fast heartbeat. You are unable to urinate, or you urinate less than normal. You have abnormal bleeding from the rectum, nose, lungs, nipples, or, if you are female, the vagina. You vomit blood. You have thoughts about hurting yourself or others. These symptoms may be an emergency. Get help right away. Call 911. Do not wait to see if the symptoms will go away. Do not drive yourself to the hospital. Get help right away if you feel like you may hurt yourself or others, or have thoughts about taking your own life. Go to your nearest emergency room or: Call 911. Call the National Suicide Prevention Lifeline at (262)721-8699 or 988. This is open 24 hours a day. Text the Crisis Text Line at 8450584327. Summary If you have fatigue, you feel tired all the time and have a lack of energy or a lack of motivation. Fatigue may make it difficult to start or complete tasks because of exhaustion. Long-term (chronic) or extreme fatigue may be a symptom of a medical condition. Exercise regularly, as told by your health care provider.  Change situations that cause you stress. Try to keep your work and personal schedules in balance. This information is not intended to replace advice given to you by your health care provider. Make sure you discuss any questions you have with your health care provider. Document Revised: 04/07/2021 Document  Reviewed: 04/07/2021 Elsevier Patient Education  2024 [employer].

## 2024-05-11 ENCOUNTER — Encounter: Payer: Self-pay | Admitting: Nurse Practitioner

## 2024-05-11 ENCOUNTER — Ambulatory Visit: Admitting: Nurse Practitioner

## 2024-05-11 VITALS — BP 124/78 | HR 63 | Temp 97.7°F | Ht 62.5 in | Wt 183.4 lb

## 2024-05-11 DIAGNOSIS — I10 Essential (primary) hypertension: Secondary | ICD-10-CM | POA: Diagnosis not present

## 2024-05-11 DIAGNOSIS — E538 Deficiency of other specified B group vitamins: Secondary | ICD-10-CM

## 2024-05-11 DIAGNOSIS — R0683 Snoring: Secondary | ICD-10-CM

## 2024-05-11 DIAGNOSIS — R5383 Other fatigue: Secondary | ICD-10-CM | POA: Insufficient documentation

## 2024-05-11 DIAGNOSIS — E039 Hypothyroidism, unspecified: Secondary | ICD-10-CM

## 2024-05-11 DIAGNOSIS — F418 Other specified anxiety disorders: Secondary | ICD-10-CM

## 2024-05-11 MED ORDER — CITALOPRAM HYDROBROMIDE 20 MG PO TABS
20.0000 mg | ORAL_TABLET | Freq: Every day | ORAL | 4 refills | Status: AC
Start: 2024-05-11 — End: ?

## 2024-05-11 NOTE — Assessment & Plan Note (Signed)
 Chronic, stable.  Labs monitored by Dr. Randy Buttery with endo at [facility] in past, but PCP took over years ago.  Continue current medication regimen as prescribed and will adjust if needed.  Thyroid labs today.

## 2024-05-11 NOTE — Assessment & Plan Note (Signed)
Ongoing, no current supplement.  Recheck level today and restart supplement as needed.

## 2024-05-11 NOTE — Assessment & Plan Note (Signed)
 Ongoing for several months, has increased stressors in life and does snore.  Some weight gain has presented over past months. Will check thyroid labs due to her hypothyroid. Sleep study ordered due to her report of snoring and her weight gain. Check B12, iron, ferritin, CBC, CMP today. Determine next steps after all labs and results return.

## 2024-05-11 NOTE — Progress Notes (Signed)
 BP 124/78 (BP Location: Left Arm, Patient Position: Sitting, Cuff Size: Normal)   Pulse 63   Temp 97.7 F (36.5 C) (Oral)   Ht 5' 2.5 (1.588 m)   Wt 183 lb 6.4 oz (83.2 kg)   LMP 12/21/2017 Comment: irregular periods  SpO2 99%   BMI 33.01 kg/m    Subjective:    Patient ID: Gabrielle Mendoza, female    DOB: 08-09-1965, 58 y.o.   MRN: [id number]  HPI: Gabrielle Mendoza is a 58 y.o. female  Chief Complaint  Patient presents with   Fatigue    Patient states she has been experiencing fatigue since the beginning of 2025. States she has not been resting well at night, snoring and waking up still tired. States she has also gained her weight back as well.    HYPERTENSION without Chronic Kidney Disease Taking Losartan  daily. Hypertension status: stable  Satisfied with current treatment? yes Duration of hypertension: chronic BP monitoring frequency:  not checking BP range:  BP medication side effects:  no Medication compliance: good compliance Aspirin: no Recurrent headaches: no Visual changes: no Palpitations: no Dyspnea: no Chest pain: no Lower extremity edema: occasional at the end of the day Dizzy/lightheaded: no   FATIGUE Duration:  months Severity: 6/10  Onset: gradual Context when symptoms started:  unknown Symptoms improve with rest: no  Depressive symptoms: yes Stress/anxiety: yes Insomnia: none Snoring: yes wakes her husband up Observed apnea by bed partner: no Daytime hypersomnolence:yes Wakes feeling refreshed: no History of sleep study: no Dysnea on exertion:  occasional if in hurry walking Orthopnea/PND: no Chest pain: no Chronic cough: no Lower extremity edema: no Arthralgias:yes - arthritis in knees Myalgias: no Weakness: no Rash: no     05/11/2024   11:00 AM  Results of the Epworth flowsheet  Sitting and reading 3  Watching TV 3  Sitting, inactive in a public place (e.g. a theatre or a meeting) 0  As a passenger in a car for an hour  without a break 0  Lying down to rest in the afternoon when circumstances permit 3  Sitting and talking to someone 0  Sitting quietly after a lunch without alcohol 2  In a car, while stopped for a few minutes in traffic 0  Total score 11    HYPOTHYROIDISM Continues Levothyroxine  daily. Takes Celexa  10 MG daily, has been taking two of these due to increased stressors in family life. Thyroid control status:stable Satisfied with current treatment? yes Medication side effects: no Medication compliance: good compliance Etiology of hypothyroidism: unknown Recent dose adjustment:no Fatigue: yes Cold intolerance: no Heat intolerance: no Weight gain: yes Weight loss: no Constipation: no Diarrhea/loose stools: no Palpitations: no Lower extremity edema: no Anxiety/depressed mood: no     05/11/2024   10:48 AM 10/18/2023    3:50 PM 06/09/2023    3:06 PM 04/27/2023    3:37 PM 10/14/2022    3:59 PM  Depression screen PHQ 2/9  Decreased Interest 0 0 0 0 0  Down, Depressed, Hopeless 0 0 0 0 0  PHQ - 2 Score 0 0 0 0 0  Altered sleeping 3 1 0 0 0  Tired, decreased energy 3 1 0 1 0  Change in appetite 1 0 0 0 0  Feeling bad or failure about yourself  0 0 0 0 0  Trouble concentrating 0 0 0 0 0  Moving slowly or fidgety/restless 0 0 0 0 0  Suicidal thoughts 0 0 0 0 0  PHQ-9 Score 7 2  0  1  0   Difficult doing work/chores Not difficult at all Not difficult at all Not difficult at all Not difficult at all Not difficult at all     Data saved with a previous flowsheet row definition       05/11/2024   10:49 AM 10/18/2023    3:50 PM 06/09/2023    3:06 PM 04/27/2023    3:37 PM  GAD 7 : Generalized Anxiety Score  Nervous, Anxious, on Edge 2 1 0 0  Control/stop worrying 2 1 0 1  Worry too much - different things 2 1 0 1  Trouble relaxing 2 0 0 0  Restless 1 0 0 0  Easily annoyed or irritable 2 1 0 1  Afraid - awful might happen 2 0 0 0  Total GAD 7 Score 13 4 0 3  Anxiety Difficulty  Somewhat difficult Not difficult at all Not difficult at all Not difficult at all   Relevant past medical, surgical, family and social history reviewed and updated as indicated. Interim medical history since our last visit reviewed. Allergies and medications reviewed and updated.  Review of Systems  Constitutional:  Positive for fatigue. Negative for activity change, appetite change, diaphoresis and fever.  Respiratory:  Negative for cough, chest tightness, shortness of breath and wheezing.   Cardiovascular:  Positive for leg swelling (occasional at end of day on sock line only). Negative for chest pain and palpitations.  Gastrointestinal: Negative.   Endocrine: Negative for cold intolerance and heat intolerance.  Neurological: Negative.   Psychiatric/Behavioral:  Negative for decreased concentration, self-injury, sleep disturbance and suicidal ideas. The patient is nervous/anxious.     Per HPI unless specifically indicated above     Objective:    BP 124/78 (BP Location: Left Arm, Patient Position: Sitting, Cuff Size: Normal)   Pulse 63   Temp 97.7 F (36.5 C) (Oral)   Ht 5' 2.5 (1.588 m)   Wt 183 lb 6.4 oz (83.2 kg)   LMP 12/21/2017 Comment: irregular periods  SpO2 99%   BMI 33.01 kg/m   Wt Readings from Last 3 Encounters:  05/11/24 183 lb 6.4 oz (83.2 kg)  10/18/23 167 lb 12.8 oz (76.1 kg)  06/09/23 159 lb 3.2 oz (72.2 kg)    Physical Exam Vitals and nursing note reviewed.  Constitutional:      General: She is awake. She is not in acute distress.    Appearance: She is well-developed and well-groomed. She is obese. She is not ill-appearing or toxic-appearing.  HENT:     Head: Normocephalic.     Right Ear: Hearing and external ear normal.     Left Ear: Hearing and external ear normal.  Eyes:     General: Lids are normal.        Right eye: No discharge.        Left eye: No discharge.     Conjunctiva/sclera: Conjunctivae normal.     Pupils: Pupils are equal, round, and  reactive to light.  Neck:     Thyroid: No thyromegaly.     Vascular: No carotid bruit.  Cardiovascular:     Rate and Rhythm: Normal rate and regular rhythm.     Heart sounds: Normal heart sounds. No murmur heard.    No gallop.  Pulmonary:     Effort: Pulmonary effort is normal. No accessory muscle usage or respiratory distress.     Breath sounds: Normal breath sounds.  Abdominal:  General: Bowel sounds are normal. There is no distension.     Palpations: Abdomen is soft.     Tenderness: There is no abdominal tenderness.  Musculoskeletal:     Cervical back: Normal range of motion and neck supple.     Right lower leg: No edema.     Left lower leg: No edema.  Lymphadenopathy:     Cervical: No cervical adenopathy.  Skin:    General: Skin is warm and dry.  Neurological:     Mental Status: She is alert and oriented to person, place, and time.     Deep Tendon Reflexes: Reflexes are normal and symmetric.     Reflex Scores:      Brachioradialis reflexes are 2+ on the right side and 2+ on the left side.      Patellar reflexes are 2+ on the right side and 2+ on the left side. Psychiatric:        Attention and Perception: Attention normal.        Mood and Affect: Mood normal.        Speech: Speech normal.        Behavior: Behavior normal. Behavior is cooperative.        Thought Content: Thought content normal.     Results for orders placed or performed in visit on 12/02/23  TSH   Collection Time: 12/02/23  1:23 PM  Result Value Ref Range   TSH 0.816 0.450 - 4.500 uIU/mL  T4, free   Collection Time: 12/02/23  1:23 PM  Result Value Ref Range   Free T4 1.72 0.82 - 1.77 ng/dL      Assessment & Plan:   Problem List Items Addressed This Visit       Cardiovascular and Mediastinum   Essential hypertension - Primary   Chronic, stable.  BP at goal in office on recheck, lots of stressors recently.  Recommend she monitor BP at least a few mornings a week at home and document.  DASH  diet at home.  Continue current medication regimen and adjust as needed.  Labs: CBC, CMP, TSH.  Refills sent up to date.         Endocrine   Acquired hypothyroidism   Chronic, stable.  Labs monitored by Dr. Melanee with endo at [facility] in past, but PCP took over years ago.  Continue current medication regimen as prescribed and will adjust if needed.  Thyroid labs today.      Relevant Orders   T4, free   TSH     Other   Situational anxiety   Chronic, ongoing.  Denies SI/HI. Continue Celexa  at 20 MG as offering benefit.  Denies SI/HI.  Educated on SSRI family + BLACK BOX warning, is aware to let PCP know if any major side effects present.  If trials increase to 40 MG in future she is to alert PCP.      Relevant Medications   citalopram  (CELEXA ) 20 MG tablet   Fatigue   Ongoing for several months, has increased stressors in life and does snore.  Some weight gain has presented over past months. Will check thyroid labs due to her hypothyroid. Sleep study ordered due to her report of snoring and her weight gain. Check B12, iron, ferritin, CBC, CMP today. Determine next steps after all labs and results return.      Relevant Orders   CBC with Differential/Platelet   Ferritin   Iron   Comprehensive metabolic panel with GFR   Ambulatory referral to  Sleep Studies   B12 deficiency   Ongoing, no current supplement.  Recheck level today and restart supplement as needed.      Relevant Orders   Vitamin B12   Other Visit Diagnoses       Snoring       Referral for sleep study placed.   Relevant Orders   Ambulatory referral to Sleep Studies        Follow up plan: Return in about 8 weeks (around 07/06/2024) for FATIGUE -- can do virtually if prefers.

## 2024-05-11 NOTE — Assessment & Plan Note (Signed)
 Chronic, ongoing.  Denies SI/HI. Continue Celexa  at 20 MG as offering benefit.  Denies SI/HI.  Educated on SSRI family + BLACK BOX warning, is aware to let PCP know if any major side effects present.  If trials increase to 40 MG in future she is to alert PCP.

## 2024-05-11 NOTE — Assessment & Plan Note (Signed)
 Chronic, stable.  BP at goal in office on recheck, lots of stressors recently.  Recommend she monitor BP at least a few mornings a week at home and document.  DASH diet at home.  Continue current medication regimen and adjust as needed.  Labs: CBC, CMP, TSH.  Refills sent up to date.

## 2024-05-12 ENCOUNTER — Ambulatory Visit: Payer: Self-pay | Admitting: Nurse Practitioner

## 2024-05-12 LAB — COMPREHENSIVE METABOLIC PANEL WITH GFR
ALT: 20 IU/L (ref 0–32)
AST: 16 IU/L (ref 0–40)
Albumin: 4.3 g/dL (ref 3.8–4.9)
Alkaline Phosphatase: 115 IU/L (ref 49–135)
BUN/Creatinine Ratio: 13 (ref 9–23)
BUN: 9 mg/dL (ref 6–24)
Bilirubin Total: 0.2 mg/dL (ref 0.0–1.2)
CO2: 25 mmol/L (ref 20–29)
Calcium: 9.7 mg/dL (ref 8.7–10.2)
Chloride: 102 mmol/L (ref 96–106)
Creatinine, Ser: 0.67 mg/dL (ref 0.57–1.00)
Globulin, Total: 2.8 g/dL (ref 1.5–4.5)
Glucose: 93 mg/dL (ref 70–99)
Potassium: 4.8 mmol/L (ref 3.5–5.2)
Sodium: 139 mmol/L (ref 134–144)
Total Protein: 7.1 g/dL (ref 6.0–8.5)
eGFR: 103 mL/min/1.73 (ref >59)

## 2024-05-12 LAB — IRON: Iron: 50 ug/dL (ref 27–159)

## 2024-05-12 LAB — CBC WITH DIFFERENTIAL/PLATELET
Basophils Absolute: 0.1 x10E3/uL (ref 0.0–0.2)
Basos: 2 %
EOS (ABSOLUTE): 0.4 x10E3/uL (ref 0.0–0.4)
Eos: 6 %
Hematocrit: 41.3 % (ref 34.0–46.6)
Hemoglobin: 13.1 g/dL (ref 11.1–15.9)
Immature Grans (Abs): 0 x10E3/uL (ref 0.0–0.1)
Immature Granulocytes: 0 %
Lymphocytes Absolute: 1.9 x10E3/uL (ref 0.7–3.1)
Lymphs: 26 %
MCH: 27.2 pg (ref 26.6–33.0)
MCHC: 31.7 g/dL (ref 31.5–35.7)
MCV: 86 fL (ref 79–97)
Monocytes Absolute: 0.5 x10E3/uL (ref 0.1–0.9)
Monocytes: 7 %
Neutrophils Absolute: 4.3 x10E3/uL (ref 1.4–7.0)
Neutrophils: 59 %
Platelets: 384 x10E3/uL (ref 150–450)
RBC: 4.82 x10E6/uL (ref 3.77–5.28)
RDW: 12.6 % (ref 11.7–15.4)
WBC: 7.3 x10E3/uL (ref 3.4–10.8)

## 2024-05-12 LAB — TSH: TSH: 0.321 u[IU]/mL — ABNORMAL LOW (ref 0.450–4.500)

## 2024-05-12 LAB — FERRITIN: Ferritin: 57 ng/mL (ref 15–150)

## 2024-05-12 LAB — VITAMIN B12: Vitamin B-12: 996 pg/mL (ref 232–1245)

## 2024-05-12 LAB — T4, FREE: Free T4: 1.58 ng/dL (ref 0.82–1.77)

## 2024-05-12 NOTE — Progress Notes (Signed)
 Contacted via MyChart - need lab only visit in 4 weeks please  Good morning Gabrielle Mendoza, your labs have returned: - TSH is on my hyperactive, low side, but Free T4 normal. I would like you to continue current Levothyroxine  dosing and we will recheck this outpatient in 4 weeks if remains low or is lower then we will adjust dose. - CBC overall normal. Iron on lower side of normal, you may benefit from taking an iron supplement or multivitamin daily to help with this. - Remainder of labs look great, no findings to explain fatigue. We will see what sleep study shows. Any questions? Keep being amazing!!  Thank you for allowing me to participate in your care.  I appreciate you. Kindest regards, Oval Moralez

## 2024-06-09 ENCOUNTER — Other Ambulatory Visit

## 2024-06-13 ENCOUNTER — Other Ambulatory Visit: Payer: Self-pay | Admitting: Nurse Practitioner

## 2024-06-13 ENCOUNTER — Other Ambulatory Visit

## 2024-06-13 DIAGNOSIS — E039 Hypothyroidism, unspecified: Secondary | ICD-10-CM

## 2024-06-14 ENCOUNTER — Ambulatory Visit: Payer: Self-pay | Admitting: Nurse Practitioner

## 2024-06-14 DIAGNOSIS — E039 Hypothyroidism, unspecified: Secondary | ICD-10-CM

## 2024-06-14 LAB — T4, FREE: Free T4: 1.58 ng/dL (ref 0.82–1.77)

## 2024-06-14 LAB — TSH: TSH: 0.231 u[IU]/mL — ABNORMAL LOW (ref 0.450–4.500)

## 2024-06-14 MED ORDER — LEVOTHYROXINE SODIUM 125 MCG PO TABS: 125.0000 ug | ORAL_TABLET | Freq: Every day | ORAL | 3 refills | Status: AC

## 2024-06-14 NOTE — Progress Notes (Signed)
 Contacted via MyChart -- needs lab only visit in 6 weeks please  Good morning Gabrielle Mendoza, your thyroid labs have returned and TSH is still on hyperactive side and has trended down a little more. We will need to adjust Levothyroxine  dosing. I am going to reduce to 125 MCG daily. Stop the 137 MCG dosing. Then we will recheck labs outpatient in 6 weeks.  Any questions? Keep being amazing!!  Thank you for allowing me to participate in your care.  I appreciate you. Kindest regards, Andy Allende

## 2024-06-27 NOTE — Progress Notes (Signed)
 Scheduled

## 2024-08-08 ENCOUNTER — Other Ambulatory Visit
